# Patient Record
Sex: Female | Born: 1995 | Race: Black or African American | Hispanic: No | Marital: Single | Smoking: Current every day smoker
Health system: Southern US, Community
[De-identification: ages and names within clinical notes are randomized; demographics above are authoritative.]

## PROBLEM LIST (undated history)

## (undated) DIAGNOSIS — J45909 Unspecified asthma, uncomplicated: Secondary | ICD-10-CM

## (undated) HISTORY — PX: NO PAST SURGERIES: SHX2092

---
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
## 2014-02-02 ENCOUNTER — Emergency Department: Payer: Self-pay | Admitting: Emergency Medicine

## 2014-02-02 LAB — COMPREHENSIVE METABOLIC PANEL
ALT: 21 U/L
ANION GAP: 5 — AB (ref 7–16)
Albumin: 3.6 g/dL — ABNORMAL LOW (ref 3.8–5.6)
Alkaline Phosphatase: 48 U/L
BUN: 13 mg/dL (ref 7–18)
Bilirubin,Total: 0.8 mg/dL (ref 0.2–1.0)
Calcium, Total: 8.5 mg/dL — ABNORMAL LOW (ref 9.0–10.7)
Chloride: 108 mmol/L — ABNORMAL HIGH (ref 98–107)
Co2: 27 mmol/L (ref 21–32)
Creatinine: 0.81 mg/dL (ref 0.60–1.30)
EGFR (African American): >60
Glucose: 99 mg/dL (ref 65–99)
OSMOLALITY: 280 (ref 275–301)
Potassium: 3.8 mmol/L (ref 3.5–5.1)
SGOT(AST): 21 U/L (ref 0–26)
Sodium: 140 mmol/L (ref 136–145)
Total Protein: 7.1 g/dL (ref 6.4–8.6)

## 2014-02-02 LAB — CBC
HCT: 41.7 % (ref 35.0–47.0)
HGB: 14.4 g/dL (ref 12.0–16.0)
MCH: 30.6 pg (ref 26.0–34.0)
MCHC: 34.5 g/dL (ref 32.0–36.0)
MCV: 89 fL (ref 80–100)
Platelet: 261 10*3/uL (ref 150–440)
RBC: 4.71 10*6/uL (ref 3.80–5.20)
RDW: 12.4 % (ref 11.5–14.5)
WBC: 7.4 10*3/uL (ref 3.6–11.0)

## 2014-02-02 LAB — URINALYSIS, COMPLETE
BILIRUBIN, UR: NEGATIVE
Blood: NEGATIVE
GLUCOSE, UR: NEGATIVE mg/dL (ref 0–75)
KETONE: NEGATIVE
Nitrite: NEGATIVE
Ph: 5 (ref 4.5–8.0)
Protein: NEGATIVE
RBC,UR: 2 /HPF (ref 0–5)
Specific Gravity: 1.028 (ref 1.003–1.030)
Squamous Epithelial: 9
WBC UR: 12 /HPF (ref 0–5)

## 2014-03-08 ENCOUNTER — Emergency Department: Payer: Self-pay | Admitting: Emergency Medicine

## 2014-08-14 ENCOUNTER — Encounter: Payer: Self-pay | Admitting: *Deleted

## 2014-08-14 ENCOUNTER — Emergency Department
Admission: EM | Admit: 2014-08-14 | Discharge: 2014-08-14 | Disposition: A | Discharge status: Home / Self Care | Payer: BLUE CROSS/BLUE SHIELD | Attending: Emergency Medicine | Admitting: Emergency Medicine

## 2014-08-14 DIAGNOSIS — R0602 Shortness of breath: Secondary | ICD-10-CM | POA: Diagnosis present

## 2014-08-14 DIAGNOSIS — J45901 Unspecified asthma with (acute) exacerbation: Secondary | ICD-10-CM | POA: Diagnosis not present

## 2014-08-14 DIAGNOSIS — Z79899 Other long term (current) drug therapy: Secondary | ICD-10-CM | POA: Insufficient documentation

## 2014-08-14 HISTORY — DX: Unspecified asthma, uncomplicated: J45.909

## 2014-08-14 MED ORDER — PREDNISONE 20 MG PO TABS
50.0000 mg | ORAL_TABLET | Freq: Once | ORAL | Status: AC
  Administered 2014-08-14: 50 mg via ORAL

## 2014-08-14 MED ORDER — IPRATROPIUM-ALBUTEROL 0.5-2.5 (3) MG/3ML IN SOLN
3.0000 mL | Freq: Once | RESPIRATORY_TRACT | Status: AC
  Administered 2014-08-14: 19:00:00 via RESPIRATORY_TRACT
  Filled 2014-08-14: qty 3

## 2014-08-14 MED ORDER — IPRATROPIUM-ALBUTEROL 0.5-2.5 (3) MG/3ML IN SOLN
RESPIRATORY_TRACT | Status: AC
  Filled 2014-08-14: qty 3

## 2014-08-14 MED ORDER — PREDNISONE 10 MG PO TABS
ORAL_TABLET | ORAL | Status: AC
  Administered 2014-08-14: 50 mg via ORAL
  Filled 2014-08-14: qty 1

## 2014-08-14 MED ORDER — IPRATROPIUM-ALBUTEROL 0.5-2.5 (3) MG/3ML IN SOLN
3.0000 mL | Freq: Once | RESPIRATORY_TRACT | Status: AC
  Administered 2014-08-14: 3 mL via RESPIRATORY_TRACT

## 2014-08-14 MED ORDER — PREDNISONE 50 MG PO TABS
50.0000 mg | ORAL_TABLET | Freq: Every day | ORAL | Status: DC
Start: 2014-08-14 — End: 2015-12-01

## 2014-08-14 MED ORDER — IPRATROPIUM-ALBUTEROL 0.5-2.5 (3) MG/3ML IN SOLN
RESPIRATORY_TRACT | Status: AC
  Administered 2014-08-14: 3 mL via RESPIRATORY_TRACT
  Filled 2014-08-14: qty 3

## 2014-08-14 MED ORDER — PREDNISONE 20 MG PO TABS
ORAL_TABLET | ORAL | Status: AC
  Filled 2014-08-14: qty 2

## 2014-08-14 MED ORDER — ALBUTEROL SULFATE (2.5 MG/3ML) 0.083% IN NEBU: 2.5000 mg | INHALATION_SOLUTION | Freq: Four times a day (QID) | RESPIRATORY_TRACT | Status: DC | PRN

## 2014-08-14 NOTE — ED Provider Notes (Signed)
[facility] Emergency Department Provider Note  ____________________________________________  Time seen: 6:20 PM  I have reviewed the triage vital signs and the nursing notes.   HISTORY  Chief Complaint No chief complaint on file.    HPI Brenda Aguirre is a 19 y.o. female who presents with shortness of breath which started this morning around approximate 8 AM. She has a history of asthma and thinks that either allergies or an upper respiratory infection has caused an exacerbation of her asthma. She is out of her albuterol nebulizing solution. She denies chest pain she does report cough. No fevers or chills.     No past medical history on file.  There are no active problems to display for this patient.   No past surgical history on file.  Current Outpatient Rx  Name  Route  Sig  Dispense  Refill  . metroNIDAZOLE (FLAGYL) 500 MG tablet   Oral   Take 1 tablet (500 mg total) by mouth 2 (two) times daily. One po bid x 7 days   14 tablet   0     Allergies Review of patient's allergies indicates no known allergies.  No family history on file.  Social History History  Substance Use Topics  . Smoking status: Never Smoker   . Smokeless tobacco: Not on file  . Alcohol Use: No    Review of Systems  Constitutional: Negative for fever. Eyes: Negative for visual changes. ENT: Negative for sore throat. Positive for runny nose Cardiovascular: Negative for chest pain. Respiratory: Positive for shortness of breath Gastrointestinal: Negative for abdominal pain, vomiting and diarrhea. Genitourinary: Negative for dysuria. Musculoskeletal: Negative for back pain. Skin: Negative for rash. Neurological: Negative for headaches, focal weakness or numbness. Psychiatric: No anxiety  10-point ROS otherwise negative.  ____________________________________________   PHYSICAL EXAM:  VITAL SIGNS: ED Triage Vitals  Enc Vitals Group     BP --      Pulse  --      Resp --      Temp --      Temp src --      SpO2 --      Weight --      Height --      Head Cir --      Peak Flow --      Pain Score --      Pain Loc --      Pain Edu? --      Excl. in [place]? --      Constitutional: Alert and oriented. Well appearing and in no distress. Eyes: Conjunctivae are normal. PERRL. Normal extraocular movements. ENT   Head: Normocephalic and atraumatic.   Nose: No congestion/rhinnorhea.   Mouth/Throat: Mucous membranes are moist.   Neck: No stridor. Hematological/Lymphatic/Immunilogical: No cervical lymphadenopathy. Cardiovascular: Normal rate, regular rhythm. Normal and symmetric distal pulses are present in all extremities. No murmurs, rubs, or gallops. Respiratory: Normal respiratory effort without tachypnea nor retractions. Wheezing diffusely, mild Gastrointestinal: Soft and nontender. No distention. There is no CVA tenderness. Genitourinary: deferred Musculoskeletal: Nontender with normal range of motion in all extremities. No joint effusions.  No lower extremity tenderness nor edema. Neurologic:  Normal speech and language. No gross focal neurologic deficits are appreciated. Speech is normal.  Skin:  Skin is warm, dry and intact. No rash noted. Psychiatric: Mood and affect are normal. Speech and behavior are normal. Patient exhibits appropriate insight and judgment.  ____________________________________________    LABS (pertinent positives/negatives)  None  ____________________________________________  EKG  None  ____________________________________________    RADIOLOGY  None  ____________________________________________   PROCEDURES  Procedure(s) performed: None  Critical Care performed:None  ____________________________________________   INITIAL IMPRESSION / ASSESSMENT AND PLAN / ED COURSE  Pertinent labs & imaging results that were available during my care of the patient were reviewed by me and  considered in my medical decision making (see chart for details).  Seen on arrival mild wheezing diffusely no acute distress. DuoNeb started prednisone 50 mg by mouth. We will reassess  ____________________________________________ ----------------------------------------- 7:19 PM on 08/14/2014 -----------------------------------------  Patient reassessed: She has no wheezing and feels much better. Okay for discharge and PCP follow-up  FINAL CLINICAL IMPRESSION(S) / ED DIAGNOSES  Final diagnoses:  Asthma attack     Lavonia Drafts, MD 08/14/14 1919

## 2014-08-14 NOTE — ED Notes (Signed)
Pt reports shortness of breath and wheezing today, has been using albuterol inhaler at home without relief. Hx of asthma

## 2014-08-14 NOTE — Discharge Instructions (Signed)

## 2015-02-08 ENCOUNTER — Emergency Department
Admission: EM | Admit: 2015-02-08 | Discharge: 2015-02-08 | Discharge status: Absent without leave | Payer: BLUE CROSS/BLUE SHIELD

## 2015-08-28 ENCOUNTER — Encounter: Payer: Self-pay | Admitting: *Deleted

## 2015-08-28 ENCOUNTER — Emergency Department: Payer: BLUE CROSS/BLUE SHIELD

## 2015-08-28 ENCOUNTER — Emergency Department
Admission: EM | Admit: 2015-08-28 | Discharge: 2015-08-28 | Disposition: A | Discharge status: Home / Self Care | Payer: BLUE CROSS/BLUE SHIELD | Attending: Emergency Medicine | Admitting: Emergency Medicine

## 2015-08-28 DIAGNOSIS — Z79899 Other long term (current) drug therapy: Secondary | ICD-10-CM | POA: Insufficient documentation

## 2015-08-28 DIAGNOSIS — R10A0 Flank pain, unspecified side: Secondary | ICD-10-CM

## 2015-08-28 DIAGNOSIS — J45909 Unspecified asthma, uncomplicated: Secondary | ICD-10-CM | POA: Insufficient documentation

## 2015-08-28 DIAGNOSIS — Z792 Long term (current) use of antibiotics: Secondary | ICD-10-CM | POA: Insufficient documentation

## 2015-08-28 DIAGNOSIS — R109 Unspecified abdominal pain: Secondary | ICD-10-CM

## 2015-08-28 DIAGNOSIS — Z7952 Long term (current) use of systemic steroids: Secondary | ICD-10-CM | POA: Insufficient documentation

## 2015-08-28 DIAGNOSIS — N3090 Cystitis, unspecified without hematuria: Secondary | ICD-10-CM

## 2015-08-28 LAB — URINALYSIS COMPLETE WITH MICROSCOPIC (ARMC ONLY)
Bilirubin Urine: NEGATIVE
Glucose, UA: NEGATIVE mg/dL
Ketones, ur: NEGATIVE mg/dL
Leukocytes, UA: NEGATIVE
NITRITE: POSITIVE — AB
PH: 5 (ref 5.0–8.0)
PROTEIN: NEGATIVE mg/dL
SPECIFIC GRAVITY, URINE: 1.018 (ref 1.005–1.030)

## 2015-08-28 LAB — CHLAMYDIA/NGC RT PCR (ARMC ONLY)
Chlamydia Tr: NOT DETECTED
N gonorrhoeae: NOT DETECTED

## 2015-08-28 LAB — BASIC METABOLIC PANEL
Anion gap: 8 (ref 5–15)
BUN: 11 mg/dL (ref 6–20)
CO2: 24 mmol/L (ref 22–32)
Calcium: 9 mg/dL (ref 8.9–10.3)
Chloride: 106 mmol/L (ref 101–111)
Creatinine, Ser: 0.8 mg/dL (ref 0.44–1.00)
GFR calc Af Amer: >60 mL/min (ref >60)
GFR calc non Af Amer: >60 mL/min (ref >60)
Glucose, Bld: 99 mg/dL (ref 65–99)
POTASSIUM: 4.1 mmol/L (ref 3.5–5.1)
SODIUM: 138 mmol/L (ref 135–145)

## 2015-08-28 LAB — CBC
HEMATOCRIT: 41.9 % (ref 35.0–47.0)
Hemoglobin: 14.6 g/dL (ref 12.0–16.0)
MCH: 30.2 pg (ref 26.0–34.0)
MCHC: 34.9 g/dL (ref 32.0–36.0)
MCV: 86.5 fL (ref 80.0–100.0)
PLATELETS: 287 10*3/uL (ref 150–440)
RBC: 4.85 MIL/uL (ref 3.80–5.20)
RDW: 13.4 % (ref 11.5–14.5)
WBC: 9.2 10*3/uL (ref 3.6–11.0)

## 2015-08-28 LAB — PREGNANCY, URINE: Preg Test, Ur: NEGATIVE

## 2015-08-28 MED ORDER — KETOROLAC TROMETHAMINE 0.5 % OP SOLN: 1.0000 [drp] | Freq: Four times a day (QID) | OPHTHALMIC | Status: DC

## 2015-08-28 MED ORDER — IBUPROFEN 800 MG PO TABS: 800.0000 mg | ORAL_TABLET | Freq: Three times a day (TID) | ORAL | Status: DC | PRN

## 2015-08-28 MED ORDER — TRAMADOL HCL 50 MG PO TABS
50.0000 mg | ORAL_TABLET | Freq: Once | ORAL | Status: AC
  Administered 2015-08-28: 50 mg via ORAL
  Filled 2015-08-28: qty 1

## 2015-08-28 MED ORDER — POLYETHYLENE GLYCOL 3350 17 G PO PACK: 17.0000 g | PACK | Freq: Every day | ORAL | Status: DC

## 2015-08-28 MED ORDER — SULFAMETHOXAZOLE-TRIMETHOPRIM 800-160 MG PO TABS: 1.0000 | ORAL_TABLET | Freq: Two times a day (BID) | ORAL | Status: DC

## 2015-08-28 MED ORDER — IBUPROFEN 800 MG PO TABS
800.0000 mg | ORAL_TABLET | Freq: Once | ORAL | Status: AC
  Administered 2015-08-28: 800 mg via ORAL
  Filled 2015-08-28: qty 1

## 2015-08-28 NOTE — ED Provider Notes (Signed)
[facility] Emergency Department Provider Note        Time seen: ----------------------------------------- 2:30 PM on 08/28/2015 -----------------------------------------    I have reviewed the triage vital signs and the nursing notes.   HISTORY  Chief Complaint Flank Pain    HPI Brenda Aguirre is a 20 y.o. female who presents ER for right flank pain. Patient states sometimes is worse when she is moving her stretching. She has had some nausea, nothing makes her symptoms better. She denies any other complaints, denies fevers, vomiting or diarrhea. His recently had some constipation.   Past Medical History  Diagnosis Date  . Asthma     There are no active problems to display for this patient.   History reviewed. No pertinent past surgical history.  Allergies Review of patient's allergies indicates no known allergies.  Social History Social History  Substance Use Topics  . Smoking status: Never Smoker   . Smokeless tobacco: None  . Alcohol Use: No    Review of Systems Constitutional: Negative for fever. Eyes: Negative for visual changes. ENT: Negative for sore throat. Cardiovascular: Negative for chest pain. Respiratory: Negative for shortness of breath. Gastrointestinal: Positive right flank pain Genitourinary: Negative for dysuria. Musculoskeletal: Negative for back pain. Skin: Negative for rash. Neurological: Negative for headaches, focal weakness or numbness.  10-point ROS otherwise negative.  ____________________________________________   PHYSICAL EXAM:  VITAL SIGNS: ED Triage Vitals  Enc Vitals Group     BP 08/28/15 1247 126/69 mmHg     Pulse Rate 08/28/15 1247 104     Resp 08/28/15 1247 20     Temp 08/28/15 1247 98.2 F (36.8 C)     Temp Source 08/28/15 1247 Oral     SpO2 08/28/15 1247 100 %     Weight 08/28/15 1247 130 lb (58.968 kg)     Height 08/28/15 1247 5' (1.524 m)     Head Cir --      Peak Flow --       Pain Score 08/28/15 1247 8     Pain Loc --      Pain Edu? --      Excl. in [place]? --     Constitutional: Alert and oriented. Well appearing and in no distress. Eyes: Conjunctivae are normal. PERRL. Normal extraocular movements. ENT   Head: Normocephalic and atraumatic.   Nose: No congestion/rhinnorhea.   Mouth/Throat: Mucous membranes are moist.   Neck: No stridor. Cardiovascular: Normal rate, regular rhythm. No murmurs, rubs, or gallops. Respiratory: Normal respiratory effort without tachypnea nor retractions. Breath sounds are clear and equal bilaterally. No wheezes/rales/rhonchi. Gastrointestinal: Soft and nontender. Normal bowel sounds Musculoskeletal: Nontender with normal range of motion in all extremities. No lower extremity tenderness nor edema. Neurologic:  Normal speech and language. No gross focal neurologic deficits are appreciated.  Skin:  Skin is warm, dry and intact. No rash noted. Psychiatric: Mood and affect are normal. Speech and behavior are normal.   ____________________________________________  ED COURSE:  Pertinent labs & imaging results that were available during my care of the patient were reviewed by me and considered in my medical decision making (see chart for details). Patient is in no acute distress, will check basic labs and likely KUB. ____________________________________________    LABS (pertinent positives/negatives)  Labs Reviewed  URINALYSIS COMPLETEWITH MICROSCOPIC ([facility] ONLY) - Abnormal; Notable for the following:    Color, Urine YELLOW (*)    APPearance HAZY (*)    Hgb urine dipstick 1+ (*)  Nitrite POSITIVE (*)    Bacteria, UA MANY (*)    Squamous Epithelial / LPF 0-5 (*)    All other components within normal limits  CHLAMYDIA/NGC RT PCR ([facility] ONLY)  BASIC METABOLIC PANEL  CBC  PREGNANCY, URINE    RADIOLOGY Images were viewed by me  KUB  IMPRESSION: No acute abnormality  noted.  ____________________________________________  FINAL ASSESSMENT AND PLAN  Flank pain, cystitis  Plan: Patient with labs and imaging as dictated above. Patient's urinalysis was somewhat suggestive of a UTI. She'll be on 3 days of Septra, I will also encourage MiraLAX as she appears somewhat constipated. She is stable for outpatient follow-up.   Earleen Newport, MD   Note: This dictation was prepared with Dragon dictation. Any transcriptional errors that result from this process are unintentional   Earleen Newport, MD 08/28/15 [phone]

## 2015-08-28 NOTE — ED Notes (Signed)
Pt complains of right flank pain radiating to abdomen, pt denies any other symptoms

## 2015-08-28 NOTE — ED Notes (Signed)
Lab called regarding add on urine, will add on at this time

## 2015-08-28 NOTE — ED Notes (Signed)
Pt returned from Xray at this time  

## 2015-08-28 NOTE — ED Notes (Signed)
MD Williams at bedside.  

## 2015-08-28 NOTE — ED Notes (Signed)
Pt c/o right flank pain that radiates to the RLQ with nausea since this morning.Brenda Aguirre

## 2015-08-28 NOTE — Discharge Instructions (Signed)
Flank Pain °Flank pain refers to pain that is located on the side of the body between the upper abdomen and the back. The pain may occur over a short period of time (acute) or may be long-term or reoccurring (chronic). It may be mild or severe. Flank pain can be caused by many things. °CAUSES  °Some of the more common causes of flank pain include: °· Muscle strains.   °· Muscle spasms.   °· A disease of your spine (vertebral disk disease).   °· A lung infection (pneumonia).   °· Fluid around your lungs (pulmonary edema).   °· A kidney infection.   °· Kidney stones.   °· A very painful skin rash caused by the chickenpox virus (shingles).   °· Gallbladder disease.   °HOME CARE INSTRUCTIONS  °Home care will depend on the cause of your pain. In general, °· Rest as directed by your caregiver. °· Drink enough fluids to keep your urine clear or pale yellow. °· Only take over-the-counter or prescription medicines as directed by your caregiver. Some medicines may help relieve the pain. °· Tell your caregiver about any changes in your pain. °· Follow up with your caregiver as directed. °SEEK IMMEDIATE MEDICAL CARE IF:  °· Your pain is not controlled with medicine.   °· You have new or worsening symptoms. °· Your pain increases.   °· You have abdominal pain.   °· You have shortness of breath.   °· You have persistent nausea or vomiting.   °· You have swelling in your abdomen.   °· You feel faint or pass out.   °· You have blood in your urine. °· You have a fever or persistent symptoms for more than 2-3 days. °· You have a fever and your symptoms suddenly get worse. °MAKE SURE YOU:  °· Understand these instructions. °· Will watch your condition. °· Will get help right away if you are not doing well or get worse. °  °This information is not intended to replace advice given to you by your health care provider. Make sure you discuss any questions you have with your health care provider. °  °Document Released: 05/15/2005 Document  Revised: 12/17/2011 Document Reviewed: 11/06/2011 °Elsevier Interactive Patient Education ©2016 [employer] Inc[employer] ° °Urinary Tract Infection °Urinary tract infections (UTIs) can develop anywhere along your urinary tract. Your urinary tract is your body's drainage system for removing wastes and extra water. Your urinary tract includes two kidneys, two ureters, a bladder, and a urethra. Your kidneys are a pair of bean-shaped organs. Each kidney is about the size of your fist. They are located below your ribs, one on each side of your spine. °CAUSES °Infections are caused by microbes, which are microscopic organisms, including fungi, viruses, and bacteria. These organisms are so small that they can only be seen through a microscope. Bacteria are the microbes that most commonly cause UTIs. °SYMPTOMS  °Symptoms of UTIs may vary by age and gender of the patient and by the location of the infection. Symptoms in young women typically include a frequent and intense urge to urinate and a painful, burning feeling in the bladder or urethra during urination. Older women and men are more likely to be tired, shaky, and weak and have muscle aches and abdominal pain. A fever may mean the infection is in your kidneys. Other symptoms of a kidney infection include pain in your back or sides below the ribs, nausea, and vomiting. °DIAGNOSIS °To diagnose a UTI, your caregiver will ask you about your symptoms. Your caregiver will also ask you to provide a urine sample. The   urine sample will be tested for bacteria and white blood cells. White blood cells are made by your body to help fight infection. °TREATMENT  °Typically, UTIs can be treated with medication. Because most UTIs are caused by a bacterial infection, they usually can be treated with the use of antibiotics. The choice of antibiotic and length of treatment depend on your symptoms and the type of bacteria causing your infection. °HOME CARE INSTRUCTIONS °· If you were prescribed  antibiotics, take them exactly as your caregiver instructs you. Finish the medication even if you feel better after you have only taken some of the medication. °· Drink enough water and fluids to keep your urine clear or pale yellow. °· Avoid caffeine, tea, and carbonated beverages. They tend to irritate your bladder. °· Empty your bladder often. Avoid holding urine for long periods of time. °· Empty your bladder before and after sexual intercourse. °· After a bowel movement, women should cleanse from front to back. Use each tissue only once. °SEEK MEDICAL CARE IF:  °· You have back pain. °· You develop a fever. °· Your symptoms do not begin to resolve within 3 days. °SEEK IMMEDIATE MEDICAL CARE IF:  °· You have severe back pain or lower abdominal pain. °· You develop chills. °· You have nausea or vomiting. °· You have continued burning or discomfort with urination. °MAKE SURE YOU:  °· Understand these instructions. °· Will watch your condition. °· Will get help right away if you are not doing well or get worse. °  °This information is not intended to replace advice given to you by your health care provider. Make sure you discuss any questions you have with your health care provider. °  °Document Released: 01/01/2005 Document Revised: 12/13/2014 Document Reviewed: 05/02/2011 °Elsevier Interactive Patient Education ©2016 Elsevier Inc. ° °

## 2015-11-30 ENCOUNTER — Emergency Department
Admission: EM | Admit: 2015-11-30 | Discharge: 2015-12-01 | Disposition: A | Discharge status: Home / Self Care | Payer: BLUE CROSS/BLUE SHIELD | Attending: Emergency Medicine | Admitting: Emergency Medicine

## 2015-11-30 ENCOUNTER — Encounter: Payer: Self-pay | Admitting: Emergency Medicine

## 2015-11-30 ENCOUNTER — Emergency Department: Payer: BLUE CROSS/BLUE SHIELD

## 2015-11-30 DIAGNOSIS — J45901 Unspecified asthma with (acute) exacerbation: Secondary | ICD-10-CM | POA: Insufficient documentation

## 2015-11-30 DIAGNOSIS — Z79899 Other long term (current) drug therapy: Secondary | ICD-10-CM | POA: Insufficient documentation

## 2015-11-30 NOTE — ED Notes (Signed)
Flex nurse to front desk to report that flex providers reviewed chart. Not willing to see patient in flex care at this time. Patient moved back into major waiting room; WTBS by EDP on major side at this time. ED charge nurse made aware.

## 2015-11-30 NOTE — ED Triage Notes (Signed)
C/O coughing and wheezing x 2 weeks.  Seen by Princella Ion and started on prednisone.  Patient states she has two prescriptions waiting to be picked up at the pharmacy, but has not picked up RX yet.

## 2015-12-01 LAB — CBC WITH DIFFERENTIAL/PLATELET
Basophils Absolute: 0.1 10*3/uL (ref 0–0.1)
Basophils Relative: 0 %
Eosinophils Absolute: 0 10*3/uL (ref 0–0.7)
Eosinophils Relative: 0 %
HEMATOCRIT: 46.3 % (ref 35.0–47.0)
Hemoglobin: 16.6 g/dL — ABNORMAL HIGH (ref 12.0–16.0)
Lymphs Abs: 0.6 10*3/uL — ABNORMAL LOW (ref 1.0–3.6)
MCH: 30.6 pg (ref 26.0–34.0)
MCHC: 35.8 g/dL (ref 32.0–36.0)
MCV: 85.5 fL (ref 80.0–100.0)
MONO ABS: 0.1 10*3/uL — AB (ref 0.2–0.9)
Neutro Abs: 13.4 10*3/uL — ABNORMAL HIGH (ref 1.4–6.5)
Neutrophils Relative %: 95 %
Platelets: 303 10*3/uL (ref 150–440)
RBC: 5.42 MIL/uL — ABNORMAL HIGH (ref 3.80–5.20)
RDW: 13 % (ref 11.5–14.5)
WBC: 14.2 10*3/uL — ABNORMAL HIGH (ref 3.6–11.0)

## 2015-12-01 LAB — BASIC METABOLIC PANEL
ANION GAP: 10 (ref 5–15)
BUN: 10 mg/dL (ref 6–20)
CO2: 24 mmol/L (ref 22–32)
Calcium: 9.9 mg/dL (ref 8.9–10.3)
Chloride: 103 mmol/L (ref 101–111)
Creatinine, Ser: 0.74 mg/dL (ref 0.44–1.00)
GFR calc Af Amer: >60 mL/min (ref >60)
GFR calc non Af Amer: >60 mL/min (ref >60)
GLUCOSE: 116 mg/dL — AB (ref 65–99)
POTASSIUM: 3.7 mmol/L (ref 3.5–5.1)
Sodium: 137 mmol/L (ref 135–145)

## 2015-12-01 LAB — FIBRIN DERIVATIVES D-DIMER (ARMC ONLY): Fibrin derivatives D-dimer (ARMC): 208 (ref 0–499)

## 2015-12-01 LAB — HCG, QUANTITATIVE, PREGNANCY: hCG, Beta Chain, Quant, S: 1 m[IU]/mL (ref <5)

## 2015-12-01 MED ORDER — IPRATROPIUM-ALBUTEROL 0.5-2.5 (3) MG/3ML IN SOLN
3.0000 mL | Freq: Once | RESPIRATORY_TRACT | Status: AC
  Administered 2015-12-01: 3 mL via RESPIRATORY_TRACT
  Filled 2015-12-01: qty 3

## 2015-12-01 MED ORDER — SODIUM CHLORIDE 0.9 % IV BOLUS (SEPSIS)
1000.0000 mL | Freq: Once | INTRAVENOUS | Status: AC
  Administered 2015-12-01: 1000 mL via INTRAVENOUS

## 2015-12-01 MED ORDER — SODIUM CHLORIDE 0.9 % IV BOLUS (SEPSIS)
1000.0000 mL | Freq: Once | INTRAVENOUS | Status: AC
  Administered 2015-12-01: 1000 mL via INTRAVENOUS
  Filled 2015-12-01: qty 1000

## 2015-12-01 MED ORDER — METHYLPREDNISOLONE SODIUM SUCC 125 MG IJ SOLR
125.0000 mg | Freq: Once | INTRAMUSCULAR | Status: AC
  Administered 2015-12-01: 125 mg via INTRAVENOUS
  Filled 2015-12-01: qty 2

## 2015-12-01 MED ORDER — AZITHROMYCIN 250 MG PO TABS: ORAL_TABLET | ORAL | 0 refills | Status: DC

## 2015-12-01 MED ORDER — ALBUTEROL SULFATE (2.5 MG/3ML) 0.083% IN NEBU
5.0000 mg | INHALATION_SOLUTION | Freq: Once | RESPIRATORY_TRACT | Status: AC
  Administered 2015-12-01: 5 mg via RESPIRATORY_TRACT
  Filled 2015-12-01: qty 6

## 2015-12-01 MED ORDER — PREDNISONE 20 MG PO TABS: 20.0000 mg | ORAL_TABLET | Freq: Every day | ORAL | 0 refills | Status: DC

## 2015-12-01 NOTE — ED Provider Notes (Signed)
[facility] Emergency Department Provider Note  ____________________________________________  Time seen: Approximately 12:41 AM  I have reviewed the triage vital signs and the nursing notes.   HISTORY  Chief Complaint Wheezing    HPI Brenda Aguirre is a 20 y.o. female who complains of shortness of breath for the past 2 weeks. Occasional cough which is nonproductive. No fevers or chills. Has seen her primary care doctor, she's been taking nebulized albuterol at home. She completed a course of prednisone. Her primary care doctor also prescribed her a long-acting corticosteroid inhaler which she has not yet picked up from the pharmacy as well as a albuterol inhaler which she is also not picked up from the pharmacy. Reports her symptoms are worse at night. No exertional symptoms. She does have some chest tightness and it is somewhat worsened by deep breathing.  No recent travel trauma hospitalizations or surgeries. No history of DVT or PE.  Patient reports using her nebulizer today "too much". She estimates she is used 6-8 albuterol doses today. Last was 8:30 PM.   Past Medical History:  Diagnosis Date  . Asthma      There are no active problems to display for this patient.    History reviewed. No pertinent surgical history.   Prior to Admission medications   Medication Sig Start Date End Date Taking? Authorizing Provider  albuterol (PROVENTIL HFA;VENTOLIN HFA) 108 (90 BASE) MCG/ACT inhaler Inhale 2 puffs into the lungs every 6 (six) hours as needed for wheezing or shortness of breath.    Historical Provider, MD  albuterol (PROVENTIL) (2.5 MG/3ML) 0.083% nebulizer solution Take 3 mLs (2.5 mg total) by nebulization every 6 (six) hours as needed for wheezing or shortness of breath. 08/14/14   Lavonia Drafts, MD  azithromycin (ZITHROMAX Z-PAK) 250 MG tablet Take 2 tablets (500 mg) on  Day 1,  followed by 1 tablet (250 mg) once daily on Days 2 through 5. 12/01/15    Carrie Mew, MD  ibuprofen (ADVIL,MOTRIN) 800 MG tablet Take 1 tablet (800 mg total) by mouth every 8 (eight) hours as needed. 08/28/15   Earleen Newport, MD  ketorolac (ACULAR) 0.5 % ophthalmic solution Place 1 drop into both eyes 4 (four) times daily. 08/28/15   Earleen Newport, MD  metroNIDAZOLE (FLAGYL) 500 MG tablet Take 1 tablet (500 mg total) by mouth 2 (two) times daily. One po bid x 7 days 12/07/12   Madaline Brilliant, MD  polyethylene glycol [facility] / GLYCOLAX) packet Take 17 g by mouth daily. 08/28/15   Earleen Newport, MD  predniSONE (DELTASONE) 20 MG tablet Take 1 tablet (20 mg total) by mouth daily. 12/01/15   Carrie Mew, MD  sulfamethoxazole-trimethoprim (BACTRIM DS) 800-160 MG tablet Take 1 tablet by mouth 2 (two) times daily. 08/28/15   Earleen Newport, MD     Allergies Review of patient's allergies indicates no known allergies.   No family history on file.  Social History Social History  Substance Use Topics  . Smoking status: Never Smoker  . Smokeless tobacco: Never Used  . Alcohol use No    Review of Systems  Constitutional:   No fever or chills.  ENT:   No sore throat. No rhinorrhea. Cardiovascular:   Positive chest tightness. Respiratory:   Positive subacute shortness of breath and nonproductive cough Gastrointestinal:   Negative for abdominal pain, vomiting and diarrhea.  Genitourinary:   Negative for dysuria or difficulty urinating. Musculoskeletal:   Negative for focal pain or swelling  10-point ROS otherwise negative.  ____________________________________________   PHYSICAL EXAM:  VITAL SIGNS: ED Triage Vitals  Enc Vitals Group     BP 11/30/15 2202 132/78     Pulse Rate 11/30/15 2202 (!) 140     Resp 11/30/15 2202 18     Temp 11/30/15 2202 (!) 94 F (34.4 C)     Temp Source 11/30/15 2202 Oral     SpO2 11/30/15 2202 94 %     Weight 11/30/15 2204 135 lb (61.2 kg)     Height 11/30/15 2204 5' (1.524 m)     Head Circumference  --      Peak Flow --      Pain Score 11/30/15 2204 0     Pain Loc --      Pain Edu? --      Excl. in [place]? --     Vital signs reviewed, nursing assessments reviewed.   Constitutional:   Alert and oriented. Well appearing and in no distress. Eyes:   No scleral icterus. No conjunctival pallor. PERRL. EOMI.  No nystagmus. ENT   Head:   Normocephalic and atraumatic.   Nose:   No congestion/rhinnorhea. No septal hematoma   Mouth/Throat:   MMM, no pharyngeal erythema. No peritonsillar mass.    Neck:   No stridor. No SubQ emphysema. No meningismus. Hematological/Lymphatic/Immunilogical:   No cervical lymphadenopathy. Cardiovascular:   Tachycardia heart rate 1:30. Symmetric bilateral radial and DP pulses.  No murmurs.  Respiratory:   Diffuse wheezing with inspiration and expiration. Normal expiratory phase.. Gastrointestinal:   Soft and nontender. Non distended. There is no CVA tenderness.  No rebound, rigidity, or guarding. Genitourinary:   deferred Musculoskeletal:   Nontender with normal range of motion in all extremities. No joint effusions.  No lower extremity tenderness.  No edema. Neurologic:   Normal speech and language.  CN 2-10 normal. Motor grossly intact. No gross focal neurologic deficits are appreciated.  Skin:    Skin is warm, dry and intact. No rash noted.  No petechiae, purpura, or bullae.  ____________________________________________    LABS (pertinent positives/negatives) (all labs ordered are listed, but only abnormal results are displayed) Labs Reviewed  BASIC METABOLIC PANEL - Abnormal; Notable for the following:       Result Value   Glucose, Bld 116 (*)    All other components within normal limits  CBC WITH DIFFERENTIAL/PLATELET - Abnormal; Notable for the following:    WBC 14.2 (*)    RBC 5.42 (*)    Hemoglobin 16.6 (*)    Neutro Abs 13.4 (*)    Lymphs Abs 0.6 (*)    Monocytes Absolute 0.1 (*)    All other components within normal limits   FIBRIN DERIVATIVES D-DIMER ([facility] ONLY)  HCG, QUANTITATIVE, PREGNANCY   ____________________________________________   EKG    ____________________________________________    RADIOLOGY  Chest x-ray unremarkable  ____________________________________________   PROCEDURES Procedures  ____________________________________________   INITIAL IMPRESSION / ASSESSMENT AND PLAN / ED COURSE  Pertinent labs & imaging results that were available during my care of the patient were reviewed by me and considered in my medical decision making (see chart for details).  Patient presents with ongoing shortness of breath and wheezing despite using bronchodilators at home medical completing a course of prednisone. Chest x-ray unremarkable. We'll check labs including d-dimer. IV fluids for her tachycardia which may be related to beta agonists overuse. Give prednisone and bronchodilators in the ED an attempt to control her wheezing. We'll plan to start  the patient on antibiotics given the prolonged symptoms.     Clinical Course  Value Comment By Time  Hemoglobin: (!) 16.6 CBC consistent with hemoconcentration. Possible dehydration as explanation for the patient's tachycardia. Patient admits that she doesn't drink a lot of fluids and only drinks soda. Carrie Mew, MD 08/26 [phone]    ----------------------------------------- 3:29 AM on 12/01/2015 -----------------------------------------  Patient feels much better, wishes to go home. Workup overall unremarkable. D-dimer negative. Repeat lung auscultation is greatly improved. Heart rate is about 125, likely in large part due to multiple doses of bronchodilators given. Low suspicion for PE ACS dissection or any other vascular event or sepsis or shock. ____________________________________________   FINAL CLINICAL IMPRESSION(S) / ED DIAGNOSES  Final diagnoses:  Asthma exacerbation       Portions of this note were generated with dragon  dictation software. Dictation errors may occur despite best attempts at proofreading.    Carrie Mew, MD 12/01/15 0330

## 2016-08-11 ENCOUNTER — Encounter: Payer: Self-pay | Admitting: Emergency Medicine

## 2016-08-11 ENCOUNTER — Emergency Department
Admission: EM | Admit: 2016-08-11 | Discharge: 2016-08-11 | Disposition: A | Discharge status: Home / Self Care | Payer: BLUE CROSS/BLUE SHIELD | Attending: Emergency Medicine | Admitting: Emergency Medicine

## 2016-08-11 ENCOUNTER — Emergency Department: Payer: BLUE CROSS/BLUE SHIELD

## 2016-08-11 DIAGNOSIS — J45901 Unspecified asthma with (acute) exacerbation: Secondary | ICD-10-CM

## 2016-08-11 DIAGNOSIS — J45909 Unspecified asthma, uncomplicated: Secondary | ICD-10-CM | POA: Insufficient documentation

## 2016-08-11 LAB — POCT PREGNANCY, URINE: PREG TEST UR: NEGATIVE

## 2016-08-11 MED ORDER — IPRATROPIUM-ALBUTEROL 0.5-2.5 (3) MG/3ML IN SOLN
3.0000 mL | Freq: Once | RESPIRATORY_TRACT | Status: AC
  Administered 2016-08-11: 3 mL via RESPIRATORY_TRACT
  Filled 2016-08-11: qty 3

## 2016-08-11 MED ORDER — ALBUTEROL SULFATE HFA 108 (90 BASE) MCG/ACT IN AERS: 2.0000 | INHALATION_SPRAY | Freq: Four times a day (QID) | RESPIRATORY_TRACT | 1 refills | Status: DC | PRN

## 2016-08-11 MED ORDER — PREDNISONE 10 MG PO TABS: ORAL_TABLET | ORAL | 0 refills | Status: DC

## 2016-08-11 MED ORDER — ALBUTEROL SULFATE (2.5 MG/3ML) 0.083% IN NEBU: 2.5000 mg | INHALATION_SOLUTION | Freq: Four times a day (QID) | RESPIRATORY_TRACT | 12 refills | Status: DC | PRN

## 2016-08-11 MED ORDER — PREDNISONE 20 MG PO TABS
60.0000 mg | ORAL_TABLET | Freq: Once | ORAL | Status: AC
  Administered 2016-08-11: 60 mg via ORAL

## 2016-08-11 MED ORDER — PREDNISONE 20 MG PO TABS
ORAL_TABLET | ORAL | Status: AC
  Filled 2016-08-11: qty 3

## 2016-08-11 MED ORDER — IPRATROPIUM-ALBUTEROL 0.5-2.5 (3) MG/3ML IN SOLN
3.0000 mL | Freq: Once | RESPIRATORY_TRACT | Status: AC
  Administered 2016-08-11: 3 mL via RESPIRATORY_TRACT

## 2016-08-11 NOTE — ED Triage Notes (Signed)
Pt c/o shortness of breath, has asthma and is out of inhaler. Wheezing heard on expiration in triage.

## 2016-08-11 NOTE — Discharge Instructions (Signed)
Follow-up with [facility] clinic or primary care provider of your choice. Call family practice is in [place] for an appointment.  Continue prednisone as directed starting tomorrow as you were given your first dose in the emergency department. Albuterol inhaler as needed for wheezing.

## 2016-08-11 NOTE — ED Provider Notes (Signed)
[facility] Emergency Department Provider Note   ____________________________________________   First MD Initiated Contact with Patient 08/11/16 1343     (approximate)  I have reviewed the triage vital signs and the nursing notes.   HISTORY  Chief Complaint Shortness of Breath    HPI Brenda Aguirre is a 21 y.o. female is here with complaint of shortness of breath today. Patient states that she has a history of asthma and she does not have an inhaler. Patient was wheezing on arrival to triage. Patient was given a duo neb nebulizer treatment in the triage area. Patient states that since being in the exam room she has began to wheeze again. Patient denies any upper respiratory symptoms, fever, chills, nausea or vomiting. Patient admits to occasionally smoking. She does not have a primary care doctor and no refills on her inhaler. She rates her pain as a 0/10.   Past Medical History:  Diagnosis Date  . Asthma     There are no active problems to display for this patient.   No past surgical history on file.  Prior to Admission medications   Medication Sig Start Date End Date Taking? Authorizing Provider  albuterol (PROVENTIL HFA;VENTOLIN HFA) 108 (90 Base) MCG/ACT inhaler Inhale 2 puffs into the lungs every 6 (six) hours as needed for wheezing or shortness of breath. 08/11/16   Johnn Hai, PA-C  albuterol (PROVENTIL) (2.5 MG/3ML) 0.083% nebulizer solution Take 3 mLs (2.5 mg total) by nebulization every 6 (six) hours as needed for wheezing or shortness of breath. 08/11/16   Johnn Hai, PA-C  polyethylene glycol (MIRALAX / GLYCOLAX) packet Take 17 g by mouth daily. 08/28/15   Earleen Newport, MD  predniSONE (DELTASONE) 10 MG tablet Take 5 tablets  tomorrow, on day 2 take 4 tablets, day 3 take 3 tablets, day 4 take 2 tablets, day 5 take 1tablets 08/11/16   Johnn Hai, PA-C    Allergies Patient has no known allergies.  No family history  on file.  Social History Social History  Substance Use Topics  . Smoking status: Never Smoker  . Smokeless tobacco: Never Used  . Alcohol use No    Review of Systems Constitutional: No fever/chills ENT: No sore throat. Cardiovascular: Denies chest pain. Respiratory:Positive for wheezing. Gastrointestinal:   No nausea, no vomiting.  Musculoskeletal: Negative for back pain. Skin: Negative for rash. Neurological: Negative for headaches, focal weakness or numbness.   ____________________________________________   PHYSICAL EXAM:  VITAL SIGNS: ED Triage Vitals [08/11/16 0940]  Enc Vitals Group     BP 122/82     Pulse Rate 82     Resp 20     Temp 98.6 F (37 C)     Temp Source Oral     SpO2 99 %     Weight 150 lb (68 kg)     Height 5' (1.524 m)     Head Circumference      Peak Flow      Pain Score 0     Pain Loc      Pain Edu?      Excl. in [place]?     Constitutional: Alert and oriented. Well appearing and in no acute distress.Patient is in no acute distress talking in complete sentences. She is also talking on her cell phone without any difficulties. Eyes: Conjunctivae are normal. PERRL. EOMI. Head: Atraumatic. Nose: No congestion/rhinnorhea. Mouth/Throat: Mucous membranes are moist.  Oropharynx non-erythematous. Neck: No stridor.   Cardiovascular:  Normal rate, regular rhythm. Grossly normal heart sounds.  Good peripheral circulation. Respiratory: Normal respiratory effort.  No retractions. Lungs CTAB. Gastrointestinal: Soft and nontender. No distention. No abdominal bruits. No CVA tenderness. Musculoskeletal: No lower extremity tenderness nor edema.  No joint effusions. Neurologic:  Normal speech and language. No gross focal neurologic deficits are appreciated. No gait instability. Skin:  Skin is warm, dry and intact. No rash noted. Psychiatric: Mood and affect are normal. Speech and behavior are normal.  ____________________________________________   LABS (all  labs ordered are listed, but only abnormal results are displayed)  Labs Reviewed  POCT PREGNANCY, URINE  POC URINE PREG, ED    RADIOLOGY Chest x-ray per radiologist is negative for cardiopulmonary disease. I, Johnn Hai, personally viewed and evaluated these images (plain radiographs) as part of my medical decision making, as well as reviewing the written report by the radiologist.  ____________________________________________   PROCEDURES  Procedure(s) performed: None  Procedures  Critical Care performed: No  ____________________________________________   INITIAL IMPRESSION / ASSESSMENT AND PLAN / ED COURSE  Pertinent labs & imaging results that were available during my care of the patient were reviewed by me and considered in my medical decision making (see chart for details).  Patient was given a nebulized treatment in the triage department as well as in the treatment room. Patient was given prednisone 60 mg by mouth. Patient is to continue with prednisone tapered dose. She was given a prescription for albuterol inhaler as well as albuterol solution and a nebulizer machine. Patient was encouraged to get a PCP to further control her asthma. She was discharged without any continued problems.    ____________________________________________   FINAL CLINICAL IMPRESSION(S) / ED DIAGNOSES  Final diagnoses:  Mild asthma with exacerbation, unspecified whether persistent      NEW MEDICATIONS STARTED DURING THIS VISIT:  Discharge Medication List as of 08/11/2016  2:33 PM       Note:  This document was prepared using Dragon voice recognition software and may include unintentional dictation errors.    Johnn Hai, PA-C 08/11/16 1509    Schuyler Amor, MD 08/18/16 [phone]

## 2017-04-12 ENCOUNTER — Encounter: Payer: Self-pay | Admitting: Emergency Medicine

## 2017-04-12 ENCOUNTER — Emergency Department
Admission: EM | Admit: 2017-04-12 | Discharge: 2017-04-12 | Disposition: A | Discharge status: Home / Self Care | Payer: BLUE CROSS/BLUE SHIELD | Attending: Emergency Medicine | Admitting: Emergency Medicine

## 2017-04-12 ENCOUNTER — Emergency Department: Payer: BLUE CROSS/BLUE SHIELD

## 2017-04-12 ENCOUNTER — Other Ambulatory Visit: Payer: Self-pay

## 2017-04-12 DIAGNOSIS — Z79899 Other long term (current) drug therapy: Secondary | ICD-10-CM | POA: Insufficient documentation

## 2017-04-12 DIAGNOSIS — J45901 Unspecified asthma with (acute) exacerbation: Secondary | ICD-10-CM | POA: Insufficient documentation

## 2017-04-12 LAB — BASIC METABOLIC PANEL
Anion gap: 10 (ref 5–15)
BUN: 8 mg/dL (ref 6–20)
CHLORIDE: 105 mmol/L (ref 101–111)
CO2: 23 mmol/L (ref 22–32)
CREATININE: 0.75 mg/dL (ref 0.44–1.00)
Calcium: 9.3 mg/dL (ref 8.9–10.3)
GFR calc non Af Amer: >60 mL/min (ref >60)
GLUCOSE: 101 mg/dL — AB (ref 65–99)
Potassium: 3.9 mmol/L (ref 3.5–5.1)
Sodium: 138 mmol/L (ref 135–145)

## 2017-04-12 LAB — CBC WITH DIFFERENTIAL/PLATELET
BASOS PCT: 1 %
Basophils Absolute: 0 10*3/uL (ref 0–0.1)
Eosinophils Absolute: 0.3 10*3/uL (ref 0–0.7)
Eosinophils Relative: 3 %
HCT: 43 % (ref 35.0–47.0)
HEMOGLOBIN: 15 g/dL (ref 12.0–16.0)
LYMPHS ABS: 1.3 10*3/uL (ref 1.0–3.6)
LYMPHS PCT: 14 %
MCH: 30.4 pg (ref 26.0–34.0)
MCHC: 34.8 g/dL (ref 32.0–36.0)
MCV: 87.4 fL (ref 80.0–100.0)
MONO ABS: 0.7 10*3/uL (ref 0.2–0.9)
Monocytes Relative: 7 %
NEUTROS ABS: 6.9 10*3/uL — AB (ref 1.4–6.5)
NEUTROS PCT: 75 %
Platelets: 270 10*3/uL (ref 150–440)
RBC: 4.92 MIL/uL (ref 3.80–5.20)
RDW: 12.9 % (ref 11.5–14.5)
WBC: 9.2 10*3/uL (ref 3.6–11.0)

## 2017-04-12 MED ORDER — METHYLPREDNISOLONE SODIUM SUCC 125 MG IJ SOLR
125.0000 mg | Freq: Once | INTRAMUSCULAR | Status: AC
  Administered 2017-04-12: 125 mg via INTRAVENOUS
  Filled 2017-04-12: qty 2

## 2017-04-12 MED ORDER — IPRATROPIUM-ALBUTEROL 0.5-2.5 (3) MG/3ML IN SOLN
9.0000 mL | Freq: Once | RESPIRATORY_TRACT | Status: AC
  Administered 2017-04-12: 9 mL via RESPIRATORY_TRACT
  Filled 2017-04-12: qty 9

## 2017-04-12 MED ORDER — PREDNISONE 20 MG PO TABS: 60.0000 mg | ORAL_TABLET | Freq: Once | ORAL | Status: DC

## 2017-04-12 MED ORDER — PREDNISONE 20 MG PO TABS: 40.0000 mg | ORAL_TABLET | Freq: Every day | ORAL | 0 refills | Status: DC
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
## 2017-07-06 ENCOUNTER — Other Ambulatory Visit: Payer: Self-pay

## 2017-07-06 ENCOUNTER — Encounter: Payer: Self-pay | Admitting: Emergency Medicine

## 2017-07-06 ENCOUNTER — Emergency Department: Payer: Self-pay

## 2017-07-06 ENCOUNTER — Emergency Department
Admission: EM | Admit: 2017-07-06 | Discharge: 2017-07-06 | Disposition: A | Discharge status: Home / Self Care | Payer: Self-pay | Attending: Student in an Organized Health Care Education/Training Program | Admitting: Student in an Organized Health Care Education/Training Program

## 2017-07-06 DIAGNOSIS — O200 Threatened abortion: Secondary | ICD-10-CM | POA: Insufficient documentation

## 2017-07-06 DIAGNOSIS — J45909 Unspecified asthma, uncomplicated: Secondary | ICD-10-CM | POA: Insufficient documentation

## 2017-07-06 DIAGNOSIS — R102 Pelvic and perineal pain: Secondary | ICD-10-CM | POA: Insufficient documentation

## 2017-07-06 DIAGNOSIS — Z79899 Other long term (current) drug therapy: Secondary | ICD-10-CM | POA: Insufficient documentation

## 2017-07-06 DIAGNOSIS — Z3A00 Weeks of gestation of pregnancy not specified: Secondary | ICD-10-CM | POA: Insufficient documentation

## 2017-07-06 LAB — URINALYSIS, COMPLETE (UACMP) WITH MICROSCOPIC
Bacteria, UA: NONE SEEN
Bilirubin Urine: NEGATIVE
Glucose, UA: NEGATIVE mg/dL
KETONES UR: 20 mg/dL — AB
LEUKOCYTES UA: NEGATIVE
Nitrite: NEGATIVE
PH: 5 (ref 5.0–8.0)
PROTEIN: 30 mg/dL — AB
Specific Gravity, Urine: 1.029 (ref 1.005–1.030)

## 2017-07-06 LAB — CBC
HEMATOCRIT: 41.7 % (ref 35.0–47.0)
HEMOGLOBIN: 14.6 g/dL (ref 12.0–16.0)
MCH: 30.4 pg (ref 26.0–34.0)
MCHC: 34.9 g/dL (ref 32.0–36.0)
MCV: 86.9 fL (ref 80.0–100.0)
Platelets: 322 10*3/uL (ref 150–440)
RBC: 4.8 MIL/uL (ref 3.80–5.20)
RDW: 12.6 % (ref 11.5–14.5)
WBC: 5.9 10*3/uL (ref 3.6–11.0)

## 2017-07-06 LAB — COMPREHENSIVE METABOLIC PANEL
ALBUMIN: 4.7 g/dL (ref 3.5–5.0)
ALT: 15 U/L (ref 14–54)
ANION GAP: 9 (ref 5–15)
AST: 18 U/L (ref 15–41)
Alkaline Phosphatase: 46 U/L (ref 38–126)
BUN: 12 mg/dL (ref 6–20)
CALCIUM: 8.9 mg/dL (ref 8.9–10.3)
CHLORIDE: 105 mmol/L (ref 101–111)
CO2: 24 mmol/L (ref 22–32)
Creatinine, Ser: 0.75 mg/dL (ref 0.44–1.00)
GFR calc Af Amer: >60 mL/min (ref >60)
GFR calc non Af Amer: >60 mL/min (ref >60)
Glucose, Bld: 94 mg/dL (ref 65–99)
Potassium: 3.5 mmol/L (ref 3.5–5.1)
SODIUM: 138 mmol/L (ref 135–145)
Total Bilirubin: 1.3 mg/dL — ABNORMAL HIGH (ref 0.3–1.2)
Total Protein: 8 g/dL (ref 6.5–8.1)

## 2017-07-06 LAB — POCT PREGNANCY, URINE: Preg Test, Ur: POSITIVE — AB

## 2017-07-06 LAB — ABO/RH: ABO/RH(D): A NEG

## 2017-07-06 LAB — HCG, QUANTITATIVE, PREGNANCY: HCG, BETA CHAIN, QUANT, S: 380 m[IU]/mL — AB (ref <5)

## 2017-07-06 MED ORDER — ACETAMINOPHEN 500 MG PO TABS
ORAL_TABLET | ORAL | Status: AC
  Administered 2017-07-06: 1000 mg via ORAL
  Filled 2017-07-06: qty 2

## 2017-07-06 MED ORDER — ACETAMINOPHEN 500 MG PO TABS
1000.0000 mg | ORAL_TABLET | Freq: Once | ORAL | Status: AC
  Administered 2017-07-06: 1000 mg via ORAL

## 2017-07-06 MED ORDER — RHO D IMMUNE GLOBULIN 1500 UNIT/2ML IJ SOSY
300.0000 ug | PREFILLED_SYRINGE | Freq: Once | INTRAMUSCULAR | Status: AC
  Administered 2017-07-06: 300 ug via INTRAMUSCULAR
  Filled 2017-07-06: qty 2

## 2017-07-06 NOTE — ED Notes (Signed)
Per lab, they will add-on ABO/Rh to labwork collected in triage.

## 2017-07-06 NOTE — ED Triage Notes (Signed)
Abdominal pain x 6 days. Vaginal bleeding.

## 2017-07-06 NOTE — ED Notes (Addendum)
Pt ambulatory upon discharge. Verbalized understanding of discharge instructions and follow-up care. VSS. Skin warm and dry. A&O x4.

## 2017-07-06 NOTE — ED Notes (Signed)
Pt still in [place]

## 2017-07-06 NOTE — ED Provider Notes (Signed)
[facility] Emergency Department Provider Note    First MD Initiated Contact with Patient 07/06/17 1740     (approximate)  I have reviewed the triage vital signs and the nursing notes.   HISTORY  Chief Complaint Abdominal Pain    HPI Brenda Aguirre is a 22 y.o. female with first trimester vaginal bleeding as well as vaginal cramping.  Patient even brings picture of products that she passed that does appear consistent with products of conception.  States she is having some right-sided abdominal pain.  Has not had confirmatory ultrasound performed.  Denies any blood thinners.  No trauma.  States that the pain is crampy in nature and mild to moderate in severity.  Past Medical History:  Diagnosis Date  . Asthma    No family history on file. History reviewed. No pertinent surgical history. There are no active problems to display for this patient.     Prior to Admission medications   Medication Sig Start Date End Date Taking? Authorizing Provider  albuterol (PROVENTIL HFA;VENTOLIN HFA) 108 (90 Base) MCG/ACT inhaler Inhale 2 puffs into the lungs every 6 (six) hours as needed for wheezing or shortness of breath. 08/11/16   Johnn Hai, PA-C  albuterol (PROVENTIL) (2.5 MG/3ML) 0.083% nebulizer solution Take 3 mLs (2.5 mg total) by nebulization every 6 (six) hours as needed for wheezing or shortness of breath. 08/11/16   Johnn Hai, PA-C  polyethylene glycol (MIRALAX / GLYCOLAX) packet Take 17 g by mouth daily. 08/28/15   Earleen Newport, MD  predniSONE (DELTASONE) 20 MG tablet Take 2 tablets (40 mg total) by mouth daily with breakfast. 04/12/17   Clearnce Hasten, Randall An, MD    Allergies Patient has no known allergies.    Social History Social History   Tobacco Use  . Smoking status: Never Smoker  . Smokeless tobacco: Never Used  Substance Use Topics  . Alcohol use: No  . Drug use: No    Review of Systems Patient denies headaches,  rhinorrhea, blurry vision, numbness, shortness of breath, chest pain, edema, cough, abdominal pain, nausea, vomiting, diarrhea, dysuria, fevers, rashes or hallucinations unless otherwise stated above in HPI. ____________________________________________   PHYSICAL EXAM:  VITAL SIGNS: Vitals:   07/06/17 1624 07/06/17 2232  BP: (!) 134/92 94/78  Pulse: 92 (!) 59  Resp: 20 18  Temp: 98.2 F (36.8 C)   SpO2: 100% 98%    Constitutional: Alert and oriented. Well appearing and in no acute distress. Eyes: Conjunctivae are normal.  Head: Atraumatic. Nose: No congestion/rhinnorhea. Mouth/Throat: Mucous membranes are moist.   Neck: No stridor. Painless ROM.  Cardiovascular: Normal rate, regular rhythm. Grossly normal heart sounds.  Good peripheral circulation. Respiratory: Normal respiratory effort.  No retractions. Lungs CTAB. Gastrointestinal: Soft and nontender. No distention. No abdominal bruits. No CVA tenderness. Genitourinary: deferred Musculoskeletal: No lower extremity tenderness nor edema.  No joint effusions. Neurologic:  Normal speech and language. No gross focal neurologic deficits are appreciated. No facial droop Skin:  Skin is warm, dry and intact. No rash noted. Psychiatric: Mood and affect are normal. Speech and behavior are normal.  ____________________________________________   LABS (all labs ordered are listed, but only abnormal results are displayed)  Results for orders placed or performed during the hospital encounter of 07/06/17 (from the past 24 hour(s))  Pregnancy, urine POC     Status: Abnormal   Collection Time: 07/06/17  4:40 PM  Result Value Ref Range   Preg Test, Ur POSITIVE (A)  NEGATIVE  Comprehensive metabolic panel     Status: Abnormal   Collection Time: 07/06/17  4:58 PM  Result Value Ref Range   Sodium 138 135 - 145 mmol/L   Potassium 3.5 3.5 - 5.1 mmol/L   Chloride 105 101 - 111 mmol/L   CO2 24 22 - 32 mmol/L   Glucose, Bld 94 65 - 99 mg/dL    BUN 12 6 - 20 mg/dL   Creatinine, Ser 0.75 0.44 - 1.00 mg/dL   Calcium 8.9 8.9 - 10.3 mg/dL   Total Protein 8.0 6.5 - 8.1 g/dL   Albumin 4.7 3.5 - 5.0 g/dL   AST 18 15 - 41 U/L   ALT 15 14 - 54 U/L   Alkaline Phosphatase 46 38 - 126 U/L   Total Bilirubin 1.3 (H) 0.3 - 1.2 mg/dL   GFR calc non Af Amer >60 >60 mL/min   GFR calc Af Amer >60 >60 mL/min   Anion gap 9 5 - 15  CBC     Status: None   Collection Time: 07/06/17  4:58 PM  Result Value Ref Range   WBC 5.9 3.6 - 11.0 K/uL   RBC 4.80 3.80 - 5.20 MIL/uL   Hemoglobin 14.6 12.0 - 16.0 g/dL   HCT 41.7 35.0 - 47.0 %   MCV 86.9 80.0 - 100.0 fL   MCH 30.4 26.0 - 34.0 pg   MCHC 34.9 32.0 - 36.0 g/dL   RDW 12.6 11.5 - 14.5 %   Platelets 322 150 - 440 K/uL  hCG, quantitative, pregnancy     Status: Abnormal   Collection Time: 07/06/17  4:58 PM  Result Value Ref Range   hCG, Beta Chain, Quant, S 380 (H) <5 mIU/mL  Urinalysis, Complete w Microscopic     Status: Abnormal   Collection Time: 07/06/17  4:58 PM  Result Value Ref Range   Color, Urine YELLOW (A) YELLOW   APPearance HAZY (A) CLEAR   Specific Gravity, Urine 1.029 1.005 - 1.030   pH 5.0 5.0 - 8.0   Glucose, UA NEGATIVE NEGATIVE mg/dL   Hgb urine dipstick LARGE (A) NEGATIVE   Bilirubin Urine NEGATIVE NEGATIVE   Ketones, ur 20 (A) NEGATIVE mg/dL   Protein, ur 30 (A) NEGATIVE mg/dL   Nitrite NEGATIVE NEGATIVE   Leukocytes, UA NEGATIVE NEGATIVE   RBC / HPF TOO NUMEROUS TO COUNT 0 - 5 RBC/hpf   WBC, UA 0-5 0 - 5 WBC/hpf   Bacteria, UA NONE SEEN NONE SEEN   Squamous Epithelial / LPF 0-5 (A) NONE SEEN   Mucus PRESENT   ABO/Rh     Status: None   Collection Time: 07/06/17  4:58 PM  Result Value Ref Range   ABO/RH(D)      A NEG Performed at [facility], [address] [place], [place] [address]   Antibody screen     Status: None   Collection Time: 07/06/17  4:58 PM  Result Value Ref Range   Antibody Screen      NEG Performed at [facility], [address] [place], [place] [address]   Rhogam injection     Status: None (Preliminary result)   Collection Time: 07/06/17  9:39 PM  Result Value Ref Range   Unit Number [id number]    Blood Component Type RHIG    Unit division 00    Status of Unit ISSUED    Transfusion Status      OK TO TRANSFUSE Performed at [facility],  [place], [place] [address]    ____________________________________________ ____________________________________________  RADIOLOGY  I personally reviewed all radiographic images ordered to evaluate for the above acute complaints and reviewed radiology reports and findings.  These findings were personally discussed with the patient.  Please see medical record for radiology report.  ____________________________________________   PROCEDURES  Procedure(s) performed:  Procedures    Critical Care performed: no ____________________________________________   INITIAL IMPRESSION / ASSESSMENT AND PLAN / ED COURSE  Pertinent labs & imaging results that were available during my care of the patient were reviewed by me and considered in my medical decision making (see chart for details).  DDX: Ectopic, threatened miscarriage, incomplete miscarriage  Brenda Aguirre is a 22 y.o. who presents to the ED with symptoms as described above.  She is hemodynamically stable.  Presentation is most clinically consistent with threatened or incomplete miscarriage.  Beta quant is low to 300.  Ultrasound more suggestive pedunculated fibroid.  Discussed that will need 48-hour repeat hCG quant level to rule out ectopic.  Patient has followed up in OB and agrees with plan.  She also demonstrates understanding that she will return to the ER in 48 hours for repeat hCG testing if unable to get in the clinic.  Based on the picture that she shows I am highly suspicious of miscarriage.  Patient given RhoGam as she is Rh-.  Have discussed with the patient and  available family all diagnostics and treatments performed thus far and all questions were answered to the best of my ability. The patient demonstrates understanding and agreement with plan.       As part of my medical decision making, I reviewed the following data within the [facility] notes reviewed and incorporated, Labs reviewed, notes from prior ED visits.  ____________________________________________   FINAL CLINICAL IMPRESSION(S) / ED DIAGNOSES  Final diagnoses:  Threatened miscarriage in early pregnancy      NEW MEDICATIONS STARTED DURING THIS VISIT:  Discharge Medication List as of 07/06/2017  8:26 PM       Note:  This document was prepared using Dragon voice recognition software and may include unintentional dictation errors.    Merlyn Lot, MD 07/06/17 [phone]

## 2017-07-06 NOTE — Discharge Instructions (Addendum)
Please follow-up with OB/GYN in 2-3 days for repeat hCG level check.  Return to the ER if unable to get in the clinic.  Return for worsening pain, fevers, weakness or for any additional questions or concerns.

## 2017-07-06 NOTE — ED Notes (Signed)
Holding pt until at least 2230 s/p IM injection.

## 2017-07-06 NOTE — ED Notes (Addendum)
Pt in [place], will assess when returns

## 2017-07-07 LAB — ANTIBODY SCREEN: Antibody Screen: NEGATIVE

## 2017-07-08 LAB — RHOGAM INJECTION: Unit division: 0

## 2017-11-25 LAB — HM PAP SMEAR: HM Pap smear: NEGATIVE

## 2017-12-13 ENCOUNTER — Other Ambulatory Visit: Payer: Self-pay

## 2017-12-13 ENCOUNTER — Emergency Department
Admission: EM | Admit: 2017-12-13 | Discharge: 2017-12-13 | Disposition: A | Discharge status: Home / Self Care | Payer: Self-pay | Attending: Emergency Medicine | Admitting: Emergency Medicine

## 2017-12-13 DIAGNOSIS — Y9389 Activity, other specified: Secondary | ICD-10-CM | POA: Insufficient documentation

## 2017-12-13 DIAGNOSIS — X58XXXA Exposure to other specified factors, initial encounter: Secondary | ICD-10-CM | POA: Insufficient documentation

## 2017-12-13 DIAGNOSIS — T192XXA Foreign body in vulva and vagina, initial encounter: Secondary | ICD-10-CM | POA: Insufficient documentation

## 2017-12-13 DIAGNOSIS — Y929 Unspecified place or not applicable: Secondary | ICD-10-CM | POA: Insufficient documentation

## 2017-12-13 DIAGNOSIS — Y999 Unspecified external cause status: Secondary | ICD-10-CM | POA: Insufficient documentation

## 2017-12-13 DIAGNOSIS — N760 Acute vaginitis: Secondary | ICD-10-CM | POA: Insufficient documentation

## 2017-12-13 DIAGNOSIS — B9689 Other specified bacterial agents as the cause of diseases classified elsewhere: Secondary | ICD-10-CM | POA: Insufficient documentation

## 2017-12-13 LAB — URINALYSIS, COMPLETE (UACMP) WITH MICROSCOPIC
BILIRUBIN URINE: NEGATIVE
Glucose, UA: NEGATIVE mg/dL
Hgb urine dipstick: NEGATIVE
Ketones, ur: NEGATIVE mg/dL
LEUKOCYTES UA: NEGATIVE
Nitrite: NEGATIVE
Protein, ur: 30 mg/dL — AB
SPECIFIC GRAVITY, URINE: 1.027 (ref 1.005–1.030)
pH: 7 (ref 5.0–8.0)

## 2017-12-13 LAB — CBC WITH DIFFERENTIAL/PLATELET
BASOS ABS: 0.1 10*3/uL (ref 0–0.1)
Basophils Relative: 1 %
EOS ABS: 0.2 10*3/uL (ref 0–0.7)
EOS PCT: 3 %
HCT: 38.6 % (ref 35.0–47.0)
Hemoglobin: 14.1 g/dL (ref 12.0–16.0)
Lymphocytes Relative: 44 %
Lymphs Abs: 3.6 10*3/uL (ref 1.0–3.6)
MCH: 31.1 pg (ref 26.0–34.0)
MCHC: 36.5 g/dL — ABNORMAL HIGH (ref 32.0–36.0)
MCV: 85.2 fL (ref 80.0–100.0)
Monocytes Absolute: 0.7 10*3/uL (ref 0.2–0.9)
Monocytes Relative: 8 %
Neutro Abs: 3.7 10*3/uL (ref 1.4–6.5)
Neutrophils Relative %: 44 %
PLATELETS: 330 10*3/uL (ref 150–440)
RBC: 4.53 MIL/uL (ref 3.80–5.20)
RDW: 13 % (ref 11.5–14.5)
WBC: 8.3 10*3/uL (ref 3.6–11.0)

## 2017-12-13 LAB — COMPREHENSIVE METABOLIC PANEL
ALBUMIN: 4.2 g/dL (ref 3.5–5.0)
ALT: 14 U/L (ref 0–44)
AST: 17 U/L (ref 15–41)
Alkaline Phosphatase: 36 U/L — ABNORMAL LOW (ref 38–126)
Anion gap: 9 (ref 5–15)
BUN: 14 mg/dL (ref 6–20)
CHLORIDE: 109 mmol/L (ref 98–111)
CO2: 23 mmol/L (ref 22–32)
CREATININE: 0.74 mg/dL (ref 0.44–1.00)
Calcium: 9.2 mg/dL (ref 8.9–10.3)
GFR calc Af Amer: >60 mL/min (ref >60)
GFR calc non Af Amer: >60 mL/min (ref >60)
Glucose, Bld: 89 mg/dL (ref 70–99)
Potassium: 3.7 mmol/L (ref 3.5–5.1)
SODIUM: 141 mmol/L (ref 135–145)
Total Bilirubin: 1.1 mg/dL (ref 0.3–1.2)
Total Protein: 7.6 g/dL (ref 6.5–8.1)

## 2017-12-13 LAB — WET PREP, GENITAL
SPERM: NONE SEEN
Trich, Wet Prep: NONE SEEN
Yeast Wet Prep HPF POC: NONE SEEN

## 2017-12-13 LAB — CHLAMYDIA/NGC RT PCR (ARMC ONLY)
Chlamydia Tr: NOT DETECTED
N gonorrhoeae: NOT DETECTED

## 2017-12-13 LAB — PREGNANCY, URINE: Preg Test, Ur: NEGATIVE

## 2017-12-13 MED ORDER — FLUCONAZOLE 150 MG PO TABS: 150.0000 mg | ORAL_TABLET | Freq: Once | ORAL | 0 refills | Status: AC

## 2017-12-13 MED ORDER — METRONIDAZOLE 500 MG PO TABS: 500.0000 mg | ORAL_TABLET | Freq: Two times a day (BID) | ORAL | 0 refills | Status: DC

## 2017-12-13 NOTE — ED Triage Notes (Signed)
Patient reports thinks may have left a tampon in for approximately a week.  Patient reports noticed an odor and pelvic cramping.

## 2017-12-13 NOTE — ED Notes (Signed)
Pelvic cart set up at bedside  

## 2017-12-13 NOTE — ED Provider Notes (Signed)
[facility] Emergency Department Provider Note  ____________________________________________  Time seen: Approximately 9:21 PM  I have reviewed the triage vital signs and the nursing notes.   HISTORY  Chief Complaint Foreign Body in Vagina    HPI Brenda Aguirre is a 22 y.o. female who presents the emergency department complaining of possible retained tampon.  Patient reports that last weekend, week ago she was partying, and believes that she inserted a tampon, prior to removing same she inserted another tampon.  Patient reports that she has had some abdominal cramping, foul odor from her vagina for the past several days.  She reports some continued bleeding past her normal period.  She is unsure whether this was from retained foreign body or whether this was abnormal uterine bleeding due to starting a new birth control method.  She denies any fevers or chills, frank abdominal pain, dysuria, hematuria.  No other complaints at this time.  Patient saw her OB/GYN 2 weeks prior.    Past Medical History:  Diagnosis Date  . Asthma     There are no active problems to display for this patient.   No past surgical history on file.  Prior to Admission medications   Medication Sig Start Date End Date Taking? Authorizing Provider  albuterol (PROVENTIL HFA;VENTOLIN HFA) 108 (90 Base) MCG/ACT inhaler Inhale 2 puffs into the lungs every 6 (six) hours as needed for wheezing or shortness of breath. 08/11/16   Johnn Hai, PA-C  albuterol (PROVENTIL) (2.5 MG/3ML) 0.083% nebulizer solution Take 3 mLs (2.5 mg total) by nebulization every 6 (six) hours as needed for wheezing or shortness of breath. 08/11/16   Johnn Hai, PA-C  fluconazole (DIFLUCAN) 150 MG tablet Take 1 tablet (150 mg total) by mouth once for 1 dose. Take after finishing antibiotics. 12/13/17 12/13/17  Cuthriell, Charline Bills, PA-C  metroNIDAZOLE (FLAGYL) 500 MG tablet Take 1 tablet (500 mg total) by mouth 2  (two) times daily. 12/13/17   Cuthriell, Charline Bills, PA-C  polyethylene glycol (MIRALAX / GLYCOLAX) packet Take 17 g by mouth daily. 08/28/15   Earleen Newport, MD  predniSONE (DELTASONE) 20 MG tablet Take 2 tablets (40 mg total) by mouth daily with breakfast. 04/12/17   Clearnce Hasten, Randall An, MD    Allergies Patient has no known allergies.  No family history on file.  Social History Social History   Tobacco Use  . Smoking status: Never Smoker  . Smokeless tobacco: Never Used  Substance Use Topics  . Alcohol use: No  . Drug use: No     Review of Systems  Constitutional: No fever/chills Eyes: No visual changes. No discharge ENT: No upper respiratory complaints. Cardiovascular: no chest pain. Respiratory: no cough. No SOB. Gastrointestinal: No abdominal pain.  No nausea, no vomiting.  No diarrhea.  No constipation. Genitourinary: Negative for dysuria. No hematuria.  Positive for vaginal odor.  Positive for possible foreign body in the vagina. Musculoskeletal: Negative for musculoskeletal pain. Skin: Negative for rash, abrasions, lacerations, ecchymosis. Neurological: Negative for headaches, focal weakness or numbness. 10-point ROS otherwise negative.  ____________________________________________   PHYSICAL EXAM:  VITAL SIGNS: ED Triage Vitals  Enc Vitals Group     BP 12/13/17 2100 139/89     Pulse Rate 12/13/17 2100 85     Resp 12/13/17 2100 16     Temp --      Temp src --      SpO2 12/13/17 2100 98 %     Weight 12/13/17 2057  165 lb (74.8 kg)     Height 12/13/17 2057 5' (1.524 m)     Head Circumference --      Peak Flow --      Pain Score 12/13/17 2056 3     Pain Loc --      Pain Edu? --      Excl. in [place]? --      Constitutional: Alert and oriented. Well appearing and in no acute distress. Eyes: Conjunctivae are normal. PERRL. EOMI. Head: Atraumatic. ENT:      Ears:       Nose: No congestion/rhinnorhea.      Mouth/Throat: Mucous membranes are moist.   Neck: No stridor.    Cardiovascular: Normal rate, regular rhythm. Normal S1 and S2.  Good peripheral circulation. Respiratory: Normal respiratory effort without tachypnea or retractions. Lungs CTAB. Good air entry to the bases with no decreased or absent breath sounds. Gastrointestinal: Bowel sounds 4 quadrants. Soft and nontender to palpation. No guarding or rigidity. No palpable masses. No distention. No CVA tenderness. Genitourinary: Visualization of external genitalia reveals no lesions, chancres, discharge, bleeding.  Speculum inserted.  In the distal vaginal canal, the tampon is visualized.  Using McGill forceps, this is successfully removed.  Cervix is edematous but not grossly erythematous.  No vaginal discharge is appreciated in the vaginal vault or canal.  No blood in the vaginal vault.  After removal of foreign body, bimanual exam reveals no palpable abnormality to the cervix, adnexa.  No cervical motion tenderness. Musculoskeletal: Full range of motion to all extremities. No gross deformities appreciated. Neurologic:  Normal speech and language. No gross focal neurologic deficits are appreciated.  Skin:  Skin is warm, dry and intact. No rash noted. Psychiatric: Mood and affect are normal. Speech and behavior are normal. Patient exhibits appropriate insight and judgement.   ____________________________________________   LABS (all labs ordered are listed, but only abnormal results are displayed)  Labs Reviewed  WET PREP, GENITAL - Abnormal; Notable for the following components:      Result Value   Clue Cells Wet Prep HPF POC PRESENT (*)    WBC, Wet Prep HPF POC FEW (*)    All other components within normal limits  URINALYSIS, COMPLETE (UACMP) WITH MICROSCOPIC - Abnormal; Notable for the following components:   Color, Urine YELLOW (*)    APPearance CLEAR (*)    Protein, ur 30 (*)    Bacteria, UA RARE (*)    All other components within normal limits  CBC WITH  DIFFERENTIAL/PLATELET - Abnormal; Notable for the following components:   MCHC 36.5 (*)    All other components within normal limits  COMPREHENSIVE METABOLIC PANEL - Abnormal; Notable for the following components:   Alkaline Phosphatase 36 (*)    All other components within normal limits  CHLAMYDIA/NGC RT PCR ([facility] ONLY)  PREGNANCY, URINE   ____________________________________________  EKG   ____________________________________________  RADIOLOGY   No results found.  ____________________________________________    PROCEDURES  Procedure(s) performed:    Procedures    Medications - No data to display   ____________________________________________   INITIAL IMPRESSION / ASSESSMENT AND PLAN / ED COURSE  Pertinent labs & imaging results that were available during my care of the patient were reviewed by me and considered in my medical decision making (see chart for details).  Review of the [place] CSRS was performed in accordance of the [place] prior to dispensing any controlled drugs.  Clinical Course as of Dec 14 2219  Sun Dec 13, 2017  2125 Patient presented to the emergency department with possible foreign body in the vagina.  Patient reports that she may have left a tampon in for greater than a week.  On exam, patient did have a retained foreign body consistent with retained tampon.  Wet prep, gonorrhea chlamydia swabs are obtained.  Urinalysis and pregnancy test will also be obtained.  Given nature of complaint, CBC and CMP are also ordered.  At this time, no indication of toxic shock syndrome.  No indication on exam of pelvic inflammatory disease.  Differential includes UTI, BV, gonorrhea and chlamydia, tract, toxic shock syndrome, PID.   [JC]    Clinical Course User Index [JC] Cuthriell, Charline Bills, PA-C     Patient's diagnosis is consistent with retained foreign body in the vagina, bacterial vaginosis.  Patient presented to the emergency department  complaining of pelvic cramping, possible retained foreign body.  Patient did have a retained tampon that had been in greater than 1 week.  Overall, exam was otherwise reassuring.  No cervical motion tenderness.  No tenderness to palpation of the bilateral adnexa, uterus.  Patient did have clue cells on wet prep, however work-up was otherwise reassuring.  At this time, patient will be treated for bacterial vaginosis.  No indication for IV antibiotics, or admission.  No indication for further work-up.  I did discuss toxic shock syndrome with the patient and gave return precautions for any symptoms that might develop.  Patient verbalizes understanding of same.. Patient will be discharged home with prescriptions for Flagyl. Patient is to follow up with OB/GYN as needed or otherwise directed. Patient is given ED precautions to return to the ED for any worsening or new symptoms.     ____________________________________________  FINAL CLINICAL IMPRESSION(S) / ED DIAGNOSES  Final diagnoses:  Foreign body in vagina, initial encounter  Bacterial vaginosis      NEW MEDICATIONS STARTED DURING THIS VISIT:  ED Discharge Orders         Ordered    metroNIDAZOLE (FLAGYL) 500 MG tablet  2 times daily     12/13/17 2218    fluconazole (DIFLUCAN) 150 MG tablet   Once     12/13/17 2218              This chart was dictated using voice recognition software/Dragon. Despite best efforts to proofread, errors can occur which can change the meaning. Any change was purely unintentional.    Darletta Moll, PA-C 12/13/17 2221    Nena Polio, MD 12/14/17 [phone]

## 2018-01-06 DIAGNOSIS — E663 Overweight: Secondary | ICD-10-CM | POA: Insufficient documentation

## 2018-01-06 DIAGNOSIS — E669 Obesity, unspecified: Secondary | ICD-10-CM | POA: Insufficient documentation

## 2018-01-24 ENCOUNTER — Emergency Department
Admission: EM | Admit: 2018-01-24 | Discharge: 2018-01-25 | Disposition: A | Discharge status: Home / Self Care | Payer: Self-pay | Attending: Emergency Medicine | Admitting: Emergency Medicine

## 2018-01-24 ENCOUNTER — Encounter: Payer: Self-pay | Admitting: Emergency Medicine

## 2018-01-24 DIAGNOSIS — Z79899 Other long term (current) drug therapy: Secondary | ICD-10-CM | POA: Insufficient documentation

## 2018-01-24 DIAGNOSIS — J45909 Unspecified asthma, uncomplicated: Secondary | ICD-10-CM | POA: Insufficient documentation

## 2018-01-24 DIAGNOSIS — N3000 Acute cystitis without hematuria: Secondary | ICD-10-CM | POA: Insufficient documentation

## 2018-01-24 DIAGNOSIS — F17210 Nicotine dependence, cigarettes, uncomplicated: Secondary | ICD-10-CM | POA: Insufficient documentation

## 2018-01-24 LAB — URINALYSIS, COMPLETE (UACMP) WITH MICROSCOPIC
BILIRUBIN URINE: NEGATIVE
Bacteria, UA: NONE SEEN
GLUCOSE, UA: NEGATIVE mg/dL
Nitrite: NEGATIVE
Protein, ur: NEGATIVE mg/dL
Specific Gravity, Urine: >1.03 — ABNORMAL HIGH (ref 1.005–1.030)
pH: 5.5 (ref 5.0–8.0)

## 2018-01-24 MED ORDER — CEPHALEXIN 500 MG PO CAPS
500.0000 mg | ORAL_CAPSULE | Freq: Once | ORAL | Status: AC
  Administered 2018-01-25: 500 mg via ORAL
  Filled 2018-01-24: qty 1

## 2018-01-24 NOTE — ED Provider Notes (Signed)
[facility] Emergency Department Provider Note  ____________________________________________  Time seen: Approximately 11:18 PM  I have reviewed the triage vital signs and the nursing notes.   HISTORY  Chief Complaint Urinary Tract Infection    HPI Brenda Aguirre is a 22 y.o. female that presents to emergency department for evaluation of dysuria and suprapubic discomfort tonight.  Patient was tested for STDs 1 month ago.  She was diagnosed with bacterial vaginitis and finished a course of metronidazole 2 weeks ago.  STD results were negative.  She is currently on her menstrual cycle.  She has never had a kidney stone.  No fever, chills, nausea, vomiting, abdominal pain, back pain, vaginal discharge, vaginal pain.   Past Medical History:  Diagnosis Date  . Asthma     There are no active problems to display for this patient.   History reviewed. No pertinent surgical history.  Prior to Admission medications   Medication Sig Start Date End Date Taking? Authorizing Provider  albuterol (PROVENTIL HFA;VENTOLIN HFA) 108 (90 Base) MCG/ACT inhaler Inhale 2 puffs into the lungs every 6 (six) hours as needed for wheezing or shortness of breath. 08/11/16   Johnn Hai, PA-C  albuterol (PROVENTIL) (2.5 MG/3ML) 0.083% nebulizer solution Take 3 mLs (2.5 mg total) by nebulization every 6 (six) hours as needed for wheezing or shortness of breath. 08/11/16   Johnn Hai, PA-C  metroNIDAZOLE (FLAGYL) 500 MG tablet Take 1 tablet (500 mg total) by mouth 2 (two) times daily. 12/13/17   Cuthriell, Charline Bills, PA-C  polyethylene glycol (MIRALAX / GLYCOLAX) packet Take 17 g by mouth daily. 08/28/15   Earleen Newport, MD  predniSONE (DELTASONE) 20 MG tablet Take 2 tablets (40 mg total) by mouth daily with breakfast. 04/12/17   Clearnce Hasten, Randall An, MD    Allergies Patient has no known allergies.  No family history on file.  Social History Social History    Tobacco Use  . Smoking status: Current Some Day Smoker  . Smokeless tobacco: Never Used  Substance Use Topics  . Alcohol use: Yes  . Drug use: No     Review of Systems  Constitutional: No fever/chills Cardiovascular: No chest pain. Respiratory:  No SOB. Gastrointestinal: Positive for suprapubic discomfort.  No nausea, no vomiting.  Genitourinary: Positive for dysuria. Musculoskeletal: Negative for musculoskeletal pain. Skin: Negative for rash, abrasions, lacerations, ecchymosis. Neurological: Negative for headaches, numbness or tingling   ____________________________________________   PHYSICAL EXAM:  VITAL SIGNS: ED Triage Vitals  Enc Vitals Group     BP 01/24/18 2232 (!) 142/80     Pulse Rate 01/24/18 2232 (!) 102     Resp 01/24/18 2232 18     Temp 01/24/18 2232 98.5 F (36.9 C)     Temp Source 01/24/18 2232 Oral     SpO2 01/24/18 2232 98 %     Weight 01/24/18 2233 165 lb (74.8 kg)     Height 01/24/18 2233 5' (1.524 m)     Head Circumference --      Peak Flow --      Pain Score 01/24/18 2233 2     Pain Loc --      Pain Edu? --      Excl. in [place]? --      Constitutional: Alert and oriented. Well appearing and in no acute distress. Eyes: Conjunctivae are normal. PERRL. EOMI. Head: Atraumatic. ENT:      Ears:      Nose: No congestion/rhinnorhea.  Mouth/Throat: Mucous membranes are moist.  Neck: No stridor.  Cardiovascular: Normal rate, regular rhythm.  Good peripheral circulation. Respiratory: Normal respiratory effort without tachypnea or retractions. Lungs CTAB. Good air entry to the bases with no decreased or absent breath sounds. Gastrointestinal: Bowel sounds 4 quadrants. Soft and nontender to palpation. No guarding or rigidity. No palpable masses. No distention. No CVA tenderness. Musculoskeletal: Full range of motion to all extremities. No gross deformities appreciated. Neurologic:  Normal speech and language. No gross focal neurologic deficits  are appreciated.  Skin:  Skin is warm, dry and intact. No rash noted. Psychiatric: Mood and affect are normal. Speech and behavior are normal. Patient exhibits appropriate insight and judgement.   ____________________________________________   LABS (all labs ordered are listed, but only abnormal results are displayed)  Labs Reviewed  URINALYSIS, COMPLETE (UACMP) WITH MICROSCOPIC - Abnormal; Notable for the following components:      Result Value   Color, Urine YELLOW (*)    APPearance CLEAR (*)    Specific Gravity, Urine >1.030 (*)    Hgb urine dipstick MODERATE (*)    Ketones, ur TRACE (*)    Leukocytes, UA TRACE (*)    All other components within normal limits   ____________________________________________  EKG   ____________________________________________  RADIOLOGY   No results found.  ____________________________________________    PROCEDURES  Procedure(s) performed:    Procedures    Medications - No data to display   ____________________________________________   INITIAL IMPRESSION / ASSESSMENT AND PLAN / ED COURSE  Pertinent labs & imaging results that were available during my care of the patient were reviewed by me and considered in my medical decision making (see chart for details).  Review of the [place] CSRS was performed in accordance of the [place] prior to dispensing any controlled drugs.   Patient's diagnosis is consistent with early urinary tract infection. Patient will be discharged home with prescriptions for keflex. Patient is to follow up with PCP as directed. Patient is given ED precautions to return to the ED for any worsening or new symptoms.   ____________________________________________  FINAL CLINICAL IMPRESSION(S) / ED DIAGNOSES  Final diagnoses:  Acute cystitis without hematuria      NEW MEDICATIONS STARTED DURING THIS VISIT:  ED Discharge Orders    None          This chart was dictated using voice recognition  software/Dragon. Despite best efforts to proofread, errors can occur which can change the meaning. Any change was purely unintentional.    Laban Emperor, PA-C 01/25/18 0002    Earleen Newport, MD 01/29/18 1356

## 2018-01-24 NOTE — ED Notes (Signed)
Patient unable to provide urine specimen at this time.

## 2018-01-24 NOTE — ED Triage Notes (Signed)
Patient states that she has a UTI. Patient with complaint of burning with urination times 3-4 days.

## 2018-01-25 MED ORDER — CEPHALEXIN 500 MG PO CAPS: 500.0000 mg | ORAL_CAPSULE | Freq: Two times a day (BID) | ORAL | 0 refills | Status: AC

## 2018-01-26 ENCOUNTER — Encounter: Payer: Self-pay | Admitting: Emergency Medicine

## 2018-01-26 ENCOUNTER — Emergency Department: Payer: Self-pay

## 2018-01-26 ENCOUNTER — Other Ambulatory Visit: Payer: Self-pay

## 2018-01-26 ENCOUNTER — Emergency Department
Admission: EM | Admit: 2018-01-26 | Discharge: 2018-01-26 | Disposition: A | Discharge status: Home / Self Care | Payer: Self-pay | Attending: Emergency Medicine | Admitting: Emergency Medicine

## 2018-01-26 DIAGNOSIS — Z79899 Other long term (current) drug therapy: Secondary | ICD-10-CM | POA: Insufficient documentation

## 2018-01-26 DIAGNOSIS — F17200 Nicotine dependence, unspecified, uncomplicated: Secondary | ICD-10-CM | POA: Insufficient documentation

## 2018-01-26 DIAGNOSIS — J40 Bronchitis, not specified as acute or chronic: Secondary | ICD-10-CM | POA: Insufficient documentation

## 2018-01-26 MED ORDER — PSEUDOEPH-BROMPHEN-DM 30-2-10 MG/5ML PO SYRP: 5.0000 mL | ORAL_SOLUTION | Freq: Four times a day (QID) | ORAL | 0 refills | Status: DC | PRN

## 2018-01-26 MED ORDER — ALBUTEROL SULFATE (2.5 MG/3ML) 0.083% IN NEBU: 5.0000 mg | INHALATION_SOLUTION | Freq: Once | RESPIRATORY_TRACT | Status: DC

## 2018-01-26 MED ORDER — METHYLPREDNISOLONE 4 MG PO TBPK: ORAL_TABLET | ORAL | 0 refills | Status: DC

## 2018-01-26 MED ORDER — IPRATROPIUM-ALBUTEROL 0.5-2.5 (3) MG/3ML IN SOLN
3.0000 mL | Freq: Once | RESPIRATORY_TRACT | Status: AC
  Administered 2018-01-26: 3 mL via RESPIRATORY_TRACT
  Filled 2018-01-26: qty 3

## 2018-01-26 NOTE — ED Triage Notes (Signed)
Pt states asthma increasing over the past few days, has used inhaler with little relief. States pain on the left side of her chest from cough and asthma. Concerned the dust in her house is causing flare up. NAD.

## 2018-01-26 NOTE — ED Notes (Signed)
First Nurse Note: Patient complaining of chest pain since last PM.  Hx of asthma.  States pain comes and goes.  Denies pain on inspiration.

## 2018-01-26 NOTE — ED Provider Notes (Addendum)
[facility] Emergency Department Provider Note   ____________________________________________   None    (approximate)  I have reviewed the triage vital signs and the nursing notes.   HISTORY  Chief Complaint Asthma and Cough    HPI Brenda Aguirre is a 22 y.o. female patient presents with cough and wheeze for the past few days.  Patient had no relief with inhaler.  Patient also complain of left chest wall pain which she believes is from coughing.  Patient believes flareup is secondary to increased dust in her house.   Past Medical History:  Diagnosis Date  . Asthma     There are no active problems to display for this patient.   History reviewed. No pertinent surgical history.  Prior to Admission medications   Medication Sig Start Date End Date Taking? Authorizing Provider  albuterol (PROVENTIL HFA;VENTOLIN HFA) 108 (90 Base) MCG/ACT inhaler Inhale 2 puffs into the lungs every 6 (six) hours as needed for wheezing or shortness of breath. 08/11/16   Johnn Hai, PA-C  albuterol (PROVENTIL) (2.5 MG/3ML) 0.083% nebulizer solution Take 3 mLs (2.5 mg total) by nebulization every 6 (six) hours as needed for wheezing or shortness of breath. 08/11/16   Johnn Hai, PA-C  brompheniramine-pseudoephedrine-DM 30-2-10 MG/5ML syrup Take 5 mLs by mouth 4 (four) times daily as needed. 01/26/18   Sable Feil, PA-C  cephALEXin (KEFLEX) 500 MG capsule Take 1 capsule (500 mg total) by mouth 2 (two) times daily for 10 days. 01/25/18 02/04/18  Laban Emperor, PA-C  methylPREDNISolone (MEDROL DOSEPAK) 4 MG TBPK tablet Take Tapered dose as directed 01/26/18   Sable Feil, PA-C  metroNIDAZOLE (FLAGYL) 500 MG tablet Take 1 tablet (500 mg total) by mouth 2 (two) times daily. 12/13/17   Cuthriell, Charline Bills, PA-C  polyethylene glycol (MIRALAX / GLYCOLAX) packet Take 17 g by mouth daily. 08/28/15   Earleen Newport, MD  predniSONE (DELTASONE) 20 MG tablet Take 2  tablets (40 mg total) by mouth daily with breakfast. 04/12/17   Clearnce Hasten, Randall An, MD    Allergies Patient has no known allergies.  No family history on file.  Social History Social History   Tobacco Use  . Smoking status: Current Some Day Smoker  . Smokeless tobacco: Never Used  Substance Use Topics  . Alcohol use: Yes  . Drug use: No    Review of Systems Constitutional: No fever/chills Eyes: No visual changes. ENT: No sore throat. Cardiovascular: Denies chest pain. Respiratory: Cough and wheezing. Gastrointestinal: No abdominal pain.  No nausea, no vomiting.  No diarrhea.  No constipation. Genitourinary: Negative for dysuria. Musculoskeletal: Left chest wall pain. Skin: Negative for rash. Neurological: Negative for headaches, focal weakness or numbness.   ____________________________________________   PHYSICAL EXAM:  VITAL SIGNS: ED Triage Vitals  Enc Vitals Group     BP 01/26/18 0840 132/66     Pulse Rate 01/26/18 0840 86     Resp 01/26/18 0840 18     Temp 01/26/18 0840 98.5 F (36.9 C)     Temp Source 01/26/18 0840 Oral     SpO2 01/26/18 0840 96 %     Weight 01/26/18 0837 165 lb (74.8 kg)     Height 01/26/18 0837 5' (1.524 m)     Head Circumference --      Peak Flow --      Pain Score 01/26/18 0837 0     Pain Loc --      Pain Edu? --  Excl. in [place]? --    Constitutional: Alert and oriented. Well appearing and in no acute distress. Nose: No congestion/rhinnorhea. Mouth/Throat: Mucous membranes are moist.  Oropharynx non-erythematous. Neck: No stridor.  Cardiovascular: Normal rate, regular rhythm. Grossly normal heart sounds.  Good peripheral circulation. Respiratory: Normal respiratory effort.  No retractions. Lungs with left wheezing.   Neurologic:  Normal speech and language. No gross focal neurologic deficits are appreciated. No gait instability. Skin:  Skin is warm, dry and intact. No rash noted. Psychiatric: Mood and affect are normal.  Speech and behavior are normal.  ____________________________________________   LABS (all labs ordered are listed, but only abnormal results are displayed)  Labs Reviewed - No data to display ____________________________________________  EKG EKG read by heart station ducted with no acute findings.  ____________________________________________  RADIOLOGY  ED MD interpretation:    Official radiology report(s): Dg Chest 2 View  Result Date: 01/26/2018 CLINICAL DATA:  Cough, wheezing, rhonchi heard over the left lung, some shortness of breath, occasional smoking history EXAM: CHEST - 2 VIEW COMPARISON:  Chest x-ray of 04/12/2017 FINDINGS: No active infiltrate or effusion is seen. There may be mild peribronchial thickening which can be seen with asthma or bronchitis. Mediastinal and hilar contours are unremarkable. The heart is within normal limits in size. No bony abnormality is seen. IMPRESSION: 1. No active lung disease. 2. Mild peribronchial thickening may indicate bronchitis. Electronically Signed   By: Ivar Drape M.D.   On: 01/26/2018 09:25    ____________________________________________   PROCEDURES  Procedure(s) performed: None  Procedures  Critical Care performed: No  ____________________________________________   INITIAL IMPRESSION / ASSESSMENT AND PLAN / ED COURSE  As part of my medical decision making, I reviewed the following data within the electronic MEDICAL RECORD NUMBER    Cough and congestion secondary to bronchitis.  Discussed chest x-ray findings with patient.  Patient given discharge care instructions and take medication as directed.  Advised to [facility].      ____________________________________________   FINAL CLINICAL IMPRESSION(S) / ED DIAGNOSES  Final diagnoses:  Bronchitis     ED Discharge Orders         Ordered    methylPREDNISolone (MEDROL DOSEPAK) 4 MG TBPK tablet     01/26/18 0937     brompheniramine-pseudoephedrine-DM 30-2-10 MG/5ML syrup  4 times daily PRN     01/26/18 0940           Note:  This document was prepared using Dragon voice recognition software and may include unintentional dictation errors.    Sable Feil, PA-C 01/26/18 0942    Sable Feil, PA-C 01/26/18 5038    Lisa Roca, MD 01/28/18 1030

## 2018-01-26 NOTE — ED Notes (Signed)
See triage note  States she has had intermittent chest discomfort over the past 1-2 days hx of asthma  And has been using her inhalers at home denies any fever  States pain is mainly to right side of chest  Afebrile on arrival

## 2018-02-08 ENCOUNTER — Encounter: Payer: Self-pay | Admitting: Emergency Medicine

## 2018-02-08 ENCOUNTER — Other Ambulatory Visit: Payer: Self-pay

## 2018-02-08 DIAGNOSIS — L209 Atopic dermatitis, unspecified: Secondary | ICD-10-CM | POA: Insufficient documentation

## 2018-02-08 DIAGNOSIS — J45909 Unspecified asthma, uncomplicated: Secondary | ICD-10-CM | POA: Insufficient documentation

## 2018-02-08 DIAGNOSIS — F17210 Nicotine dependence, cigarettes, uncomplicated: Secondary | ICD-10-CM | POA: Insufficient documentation

## 2018-02-08 DIAGNOSIS — Z79899 Other long term (current) drug therapy: Secondary | ICD-10-CM | POA: Insufficient documentation

## 2018-02-08 NOTE — ED Triage Notes (Signed)
Patient ambulatory to triage with steady gait, without difficulty or distress noted; pt reports itchy rash to trunk/arms x wk with no known cause

## 2018-02-09 ENCOUNTER — Emergency Department
Admission: EM | Admit: 2018-02-09 | Discharge: 2018-02-09 | Disposition: A | Discharge status: Home / Self Care | Payer: Self-pay | Attending: Emergency Medicine | Admitting: Emergency Medicine

## 2018-02-09 DIAGNOSIS — L209 Atopic dermatitis, unspecified: Secondary | ICD-10-CM

## 2018-02-09 DIAGNOSIS — R21 Rash and other nonspecific skin eruption: Secondary | ICD-10-CM

## 2018-02-09 MED ORDER — PREDNISONE 20 MG PO TABS: 60.0000 mg | ORAL_TABLET | ORAL | Status: DC

## 2018-02-09 MED ORDER — PREDNISONE 20 MG PO TABS
40.0000 mg | ORAL_TABLET | ORAL | Status: AC
  Administered 2018-02-09: 40 mg via ORAL
  Filled 2018-02-09: qty 2

## 2018-02-09 MED ORDER — PREDNISONE 20 MG PO TABS: ORAL_TABLET | ORAL | 0 refills | Status: DC

## 2018-02-09 NOTE — ED Provider Notes (Signed)
Brenda Aguirre  ____________________________________________   First MD Initiated Contact with Patient 02/09/18 0158     (approximate)  I have reviewed the triage vital signs and the nursing notes.   HISTORY  Chief Complaint Rash    HPI Brenda Aguirre is a 22 y.o. female with a medical history of chronic asthma since childhood.  She presents for evaluation of a rash that has been gradually getting worse over the last week.  It started on her chest and is primarily on her chest although it is also on her back.  She has a couple of small lesions on her arms but those are not as big.  She describes them as dry and flaky, red, and itching.  Nothing particular makes it better or worse.  She has not been diagnosed in the past with eczema.  She denies fever/chills, chest pain, shortness of breath, nausea, vomiting, and abdominal pain.  She was concerned she may have breast cancer because she googled the symptoms and read about peau d'orange and was concerned that at least 1 of the lesions might be that.  She describes the symptoms as moderate to severe.  She has stayed recently with someone that she has not stayed with before and she also recently used a different kind of soap.  No new medications within the last week.  Past Medical History:  Diagnosis Date  . Asthma     There are no active problems to display for this patient.   History reviewed. No pertinent surgical history.  Prior to Admission medications   Medication Sig Start Date End Date Taking? Authorizing Provider  albuterol (PROVENTIL HFA;VENTOLIN HFA) 108 (90 Base) MCG/ACT inhaler Inhale 2 puffs into the lungs every 6 (six) hours as needed for wheezing or shortness of breath. 08/11/16   Johnn Hai, PA-C  albuterol (PROVENTIL) (2.5 MG/3ML) 0.083% nebulizer solution Take 3 mLs (2.5 mg total) by nebulization every 6 (six) hours as needed for wheezing or shortness of  breath. 08/11/16   Johnn Hai, PA-C  brompheniramine-pseudoephedrine-DM 30-2-10 MG/5ML syrup Take 5 mLs by mouth 4 (four) times daily as needed. 01/26/18   Sable Feil, PA-C  methylPREDNISolone (MEDROL DOSEPAK) 4 MG TBPK tablet Take Tapered dose as directed 01/26/18   Sable Feil, PA-C  metroNIDAZOLE (FLAGYL) 500 MG tablet Take 1 tablet (500 mg total) by mouth 2 (two) times daily. 12/13/17   Cuthriell, Charline Bills, PA-C  polyethylene glycol (MIRALAX / GLYCOLAX) packet Take 17 g by mouth daily. 08/28/15   Earleen Newport, MD  predniSONE (DELTASONE) 20 MG tablet Take 40 mg (2 tablets) by mouth daily for four (4) days.  Then take 20 mg (1 tablet) by mouth daily for four (4) more days. 02/09/18   Hinda Kehr, MD    Allergies Patient has no known allergies.  No family history on file.  Social History Social History   Tobacco Use  . Smoking status: Current Some Day Smoker  . Smokeless tobacco: Never Used  Substance Use Topics  . Alcohol use: Yes  . Drug use: No    Review of Systems Constitutional: No fever/chills Eyes: No visual changes. ENT: No sore throat. Cardiovascular: Denies chest pain. Respiratory: Denies shortness of breath. Gastrointestinal: No abdominal pain.  No nausea, no vomiting.   Integumentary: Itchy red and flaky rash as described above. Neurological: Negative for headaches, focal weakness or numbness.   ____________________________________________   PHYSICAL EXAM:  VITAL SIGNS: ED  Triage Vitals  Enc Vitals Group     BP 02/08/18 2314 125/79     Pulse Rate 02/08/18 2314 97     Resp 02/08/18 2314 16     Temp 02/08/18 2314 98.3 F (36.8 C)     Temp Source 02/08/18 2314 Oral     SpO2 02/08/18 2314 100 %     Weight 02/08/18 2315 74.8 kg (165 lb)     Height 02/08/18 2315 1.524 m (5')     Head Circumference --      Peak Flow --      Pain Score 02/08/18 2319 0     Pain Loc --      Pain Edu? --      Excl. in [place]? --     Constitutional: Alert  and oriented. Well appearing and in no acute distress. Eyes: Conjunctivae are normal.  Head: Atraumatic. Nose: No congestion/rhinnorhea. Mouth/Throat: Mucous membranes are moist. Oropharynx non-erythematous.  No oral lesions or mucosal involvement. Neck: No stridor.  No meningeal signs.   Cardiovascular: Normal rate, regular rhythm. Good peripheral circulation. Grossly normal heart sounds. Respiratory: Normal respiratory effort.  No retractions. Lungs CTAB. Gastrointestinal: Soft and nontender. No distention.  Musculoskeletal: No lower extremity tenderness nor edema. No gross deformities of extremities. Neurologic:  Normal speech and language. No gross focal neurologic deficits are appreciated.  Skin:  Skin is warm, dry and intact.  The patient has multiple lesions on her anterior trunk and less so on the back that are most consistent with atopic dermatitis.  She also has some excoriations where she has been scratching and a few small areas that look urticarial but these are very mild compared to the overall atopic dermatitis appearance.  Nothing appears consistent with a more urgent or emergent rash such as erythema migrans, SJS/TEN, erythema nodosum, etc.  Also does not appear consistent with pityriasis rosea given that there are multiple lesions rather than one herald patch.   ____________________________________________   LABS (all labs ordered are listed, but only abnormal results are displayed)  Labs Reviewed - No data to display ____________________________________________  EKG  No indication for EKG ____________________________________________  RADIOLOGY   ED MD interpretation: No indication for imaging  Official radiology report(s): No results found.  ____________________________________________   PROCEDURES  Critical Care performed: No   Procedure(s) performed:   Procedures   ____________________________________________   INITIAL IMPRESSION / ASSESSMENT  AND PLAN / ED COURSE  As part of my medical decision making, I reviewed the following data within the [place] notes reviewed and incorporated, Old chart reviewed and Notes from prior ED visits    Rash is most consistent with atopic dermatitis.  Pityriasis rosacea would be also on the differential but I think less likely based on the overall appearance and the presence of multiple lesions that would have to be considered multiple herald patches.  The patient has asthma and atopic dermatitis/eczema and asthma for the most likely.  She could also be having an allergic reaction to either an allergen in the home at which she recently stayed or her change in soap.  I had my usual customary rash discussion with the patient.  I gave her a first dose of prednisone 40 mg and encouraged the use of over-the-counter Benadryl.  She will take a course of Benadryl as prescribed below (40 mg by mouth daily for 4 days followed by 20 mg by mouth daily for 4 days) I will follow-up with dermatology next week  if she is still having issues.  I gave my usual and customary return precautions.     ____________________________________________  FINAL CLINICAL IMPRESSION(S) / ED DIAGNOSES  Final diagnoses:  Rash and nonspecific skin eruption  Atopic dermatitis, unspecified type     MEDICATIONS GIVEN DURING THIS VISIT:  Medications  predniSONE (DELTASONE) tablet 40 mg (40 mg Oral Given 02/09/18 0227)     ED Discharge Orders         Ordered    predniSONE (DELTASONE) 20 MG tablet     02/09/18 0217           Aguirre:  This document was prepared using Dragon voice recognition software and may include unintentional dictation errors.    Hinda Kehr, MD 02/09/18 [phone]

## 2018-02-09 NOTE — Discharge Instructions (Signed)
Your rash is most consistent with what is called atopic dermatitis.  It is not an infection, but an inflammation of the skin, and it is common in people who also suffer from asthma.  Please read through the included papers for more information and management recommendations, and take the prescription for prednisone for the next eight days.  You should also use over-the-counter Benadryl as needed according to the instructions on the label, but remember that it may make you sleepy so we do not recommend you take it before driving or going to work.  If you are still having problems next week we recommend you follow-up with a dermatologist.  Please use the contact information listed in this paperwork.    Return to the emergency department if you develop new or worsening symptoms that concern you.

## 2018-02-09 NOTE — ED Notes (Signed)
Pt ambulatory to restroom with steady gait. No distress noted at this time.

## 2018-02-14 ENCOUNTER — Emergency Department
Admission: EM | Admit: 2018-02-14 | Discharge: 2018-02-14 | Disposition: A | Discharge status: Home / Self Care | Payer: Self-pay | Attending: Emergency Medicine | Admitting: Emergency Medicine

## 2018-02-14 ENCOUNTER — Emergency Department: Payer: Self-pay

## 2018-02-14 DIAGNOSIS — Z79899 Other long term (current) drug therapy: Secondary | ICD-10-CM | POA: Insufficient documentation

## 2018-02-14 DIAGNOSIS — F17200 Nicotine dependence, unspecified, uncomplicated: Secondary | ICD-10-CM | POA: Insufficient documentation

## 2018-02-14 DIAGNOSIS — J45998 Other asthma: Secondary | ICD-10-CM | POA: Insufficient documentation

## 2018-02-14 DIAGNOSIS — J45901 Unspecified asthma with (acute) exacerbation: Secondary | ICD-10-CM

## 2018-02-14 DIAGNOSIS — L42 Pityriasis rosea: Secondary | ICD-10-CM | POA: Insufficient documentation

## 2018-02-14 LAB — RAPID HIV SCREEN (HIV 1/2 AB+AG)
HIV 1/2 Antibodies: NONREACTIVE
HIV-1 P24 ANTIGEN - HIV24: NONREACTIVE

## 2018-02-14 MED ORDER — ALBUTEROL SULFATE (2.5 MG/3ML) 0.083% IN NEBU
5.0000 mg | INHALATION_SOLUTION | Freq: Once | RESPIRATORY_TRACT | Status: AC
  Administered 2018-02-14: 5 mg via RESPIRATORY_TRACT
  Filled 2018-02-14: qty 6

## 2018-02-14 MED ORDER — TRIAMCINOLONE ACETONIDE 0.1 % EX CREA: 1.0000 "application " | TOPICAL_CREAM | Freq: Two times a day (BID) | CUTANEOUS | 0 refills | Status: DC

## 2018-02-14 MED ORDER — PREDNISONE 20 MG PO TABS
60.0000 mg | ORAL_TABLET | Freq: Once | ORAL | Status: AC
  Administered 2018-02-14: 60 mg via ORAL
  Filled 2018-02-14: qty 3

## 2018-02-14 NOTE — ED Notes (Signed)
Patient transported to X-ray 

## 2018-02-14 NOTE — Discharge Instructions (Signed)
This is most likely pityriasis rosea, we did discuss other testing we are due but we do feel this is most likely pityriasis rosea.  We do asked that you follow-up with a dermatologist in the next few days.  Please use the cream as provided for the next week or 2.  This can last up to 9 weeks, so I do not be surprised if it indoors.  If you have any new or worrisome symptoms including involvement of your mouth or your eyes, or any other mucosal surfaces such as your vagina, or if you have fever, significant shortness of breath cough wheeze or other concerns please return to the emergency department.  Otherwise, follow-up with primary care doctor.  You have been given a prescription for steroids, please take the prescription until is gone.  We also give you a cream to place on that that should help with the rash.

## 2018-02-14 NOTE — ED Provider Notes (Signed)
[facility] Emergency Department Provider Note  ____________________________________________   I have reviewed the triage vital signs and the nursing notes. Where available I have reviewed prior notes and, if possible and indicated, outside hospital notes.    HISTORY  Chief Complaint Rash and Shortness of Breath    HPI Brenda Aguirre is a 22 y.o. female who has a history of chronic asthma, states that she is having a mild asthma flare over the last day or so but that is not why she is here.  This is routine for her she states "I am a lifelong asthma patient".  She does not feel that she is significantly ill by asthma standards, she states that is not unusual for her to have a slight wheeze.  Patient states the reason she is here, is because she has had a diffuse, flat rash which is not involving the hands or the soles or the tongue or any mucosal surfaces, its been there for 2 weeks.  Started a little bit on her back and on her chest.  Mostly is on the torso.  There is been no systemic illness.  She denies any productive cough.  She denies any vaginal discharge or STI symptoms.  She has had no fever or chills.  Occasionally, the rash is itchy.  She was seen here for it a week ago and they ordered prednisone but she has not yet taken it.  She got the prescription filled but has not taken any of the pills.    Past Medical History:  Diagnosis Date  . Asthma     There are no active problems to display for this patient.   History reviewed. No pertinent surgical history.  Prior to Admission medications   Medication Sig Start Date End Date Taking? Authorizing Provider  albuterol (PROVENTIL HFA;VENTOLIN HFA) 108 (90 Base) MCG/ACT inhaler Inhale 2 puffs into the lungs every 6 (six) hours as needed for wheezing or shortness of breath. 08/11/16   Johnn Hai, PA-C  albuterol (PROVENTIL) (2.5 MG/3ML) 0.083% nebulizer solution Take 3 mLs (2.5 mg total) by nebulization  every 6 (six) hours as needed for wheezing or shortness of breath. 08/11/16   Johnn Hai, PA-C  brompheniramine-pseudoephedrine-DM 30-2-10 MG/5ML syrup Take 5 mLs by mouth 4 (four) times daily as needed. 01/26/18   Sable Feil, PA-C  methylPREDNISolone (MEDROL DOSEPAK) 4 MG TBPK tablet Take Tapered dose as directed 01/26/18   Sable Feil, PA-C  metroNIDAZOLE (FLAGYL) 500 MG tablet Take 1 tablet (500 mg total) by mouth 2 (two) times daily. 12/13/17   Cuthriell, Charline Bills, PA-C  polyethylene glycol (MIRALAX / GLYCOLAX) packet Take 17 g by mouth daily. 08/28/15   Earleen Newport, MD  predniSONE (DELTASONE) 20 MG tablet Take 40 mg (2 tablets) by mouth daily for four (4) days.  Then take 20 mg (1 tablet) by mouth daily for four (4) more days. 02/09/18   Hinda Kehr, MD    Allergies Patient has no known allergies.  History reviewed. No pertinent family history.  Social History Social History   Tobacco Use  . Smoking status: Current Some Day Smoker  . Smokeless tobacco: Never Used  Substance Use Topics  . Alcohol use: Yes  . Drug use: No    Review of Systems Constitutional: No fever/chills Eyes: No visual changes. ENT: No sore throat. No stiff neck no neck pain Cardiovascular: Denies chest pain. Respiratory: Positive chronic asthmatic related shortness of breath. Gastrointestinal:   no  vomiting.  No diarrhea.  No constipation. Genitourinary: Negative for dysuria. Musculoskeletal: Negative lower extremity swelling Skin: + for rash. Neurological: Negative for severe headaches, focal weakness or numbness.   ____________________________________________   PHYSICAL EXAM:  VITAL SIGNS: ED Triage Vitals  Enc Vitals Group     BP 02/14/18 1422 124/83     Pulse Rate 02/14/18 1422 (!) 109     Resp 02/14/18 1422 14     Temp 02/14/18 1422 99 F (37.2 C)     Temp Source 02/14/18 1422 Oral     SpO2 02/14/18 1422 91 %     Weight 02/14/18 1425 165 lb (74.8 kg)     Height  02/14/18 1425 5' (1.524 m)     Head Circumference --      Peak Flow --      Pain Score 02/14/18 1424 2     Pain Loc --      Pain Edu? --      Excl. in [place]? --     Constitutional: Alert and oriented. Well appearing and in no acute distress.  Thing and joking with me looking at her cell phone Eyes: Conjunctivae are normal, no evidence of ocular or oral involvement of the rash Head: Atraumatic HEENT: No congestion/rhinnorhea. Mucous membranes are moist.  Oropharynx non-erythematous Neck:   Nontender with no meningismus, no masses, no stridor Cardiovascular: Normal rate, regular rhythm. Grossly normal heart sounds.  Good peripheral circulation. Respiratory: Normal respiratory effort.  No retractions. Lungs CTAB. Abdominal: Soft and nontender. No distention. No guarding no rebound Back:  There is no focal tenderness or step off.  there is no midline tenderness there are no lesions noted. there is no CVA tenderness Musculoskeletal: No lower extremity tenderness, no upper extremity tenderness. No joint effusions, no DVT signs strong distal pulses no edema Neurologic:  Normal speech and language. No gross focal neurologic deficits are appreciated.  Skin:  Skin is warm, dry and intact. No rash noted.  Female nurse chaperone present, there is a diffuse rash mostly on the torso, flat plaques, plaques are blanchable, they are not petechial, there are no petechiae or purpura, there is a larger patch on the back which is approximately 6 cm across most of them there are 2 to 3 cm, they do seem to follow the stress lines of the back.  They also do involve the chest including the breast area and abdomen.  None of them are fluctuant none of them are indurated none of them are tender. Psychiatric: Mood and affect are normal. Speech and behavior are normal.  ____________________________________________   LABS (all labs ordered are listed, but only abnormal results are displayed)  Labs Reviewed  RPR  HIV  ANTIBODY (ROUTINE TESTING W REFLEX)  RAPID HIV SCREEN (HIV 1/2 AB+AG)    Pertinent labs  results that were available during my care of the patient were reviewed by me and considered in my medical decision making (see chart for details). ____________________________________________  EKG  I personally interpreted any EKGs ordered by me or triage  ____________________________________________  RADIOLOGY  Pertinent labs & imaging results that were available during my care of the patient were reviewed by me and considered in my medical decision making (see chart for details). If possible, patient and/or family made aware of any abnormal findings.  Dg Chest 2 View  Result Date: 02/14/2018 CLINICAL DATA:  Known asthma, hx of recent bronchitis diagnosis. SOB and possible allergic reaction EXAM: CHEST - 2 VIEW COMPARISON:  Chest x-ray  dated 01/26/2018. FINDINGS: The heart size and mediastinal contours are within normal limits. Both lungs are clear. The visualized skeletal structures are unremarkable. IMPRESSION: No active cardiopulmonary disease. No evidence of pneumonia or pulmonary edema. Electronically Signed   By: Franki Cabot M.D.   On: 02/14/2018 15:47   ____________________________________________    PROCEDURES  Procedure(s) performed: None  Procedures  Critical Care performed: None  ____________________________________________   INITIAL IMPRESSION / ASSESSMENT AND PLAN / ED COURSE  Pertinent labs & imaging results that were available during my care of the patient were reviewed by me and considered in my medical decision making (see chart for details).  She is here with 2 complaints, they do not seem to me to be likely related.  The first is lifelong asthma, she is having a mild flare, will give her prednisone and albuterol and see if she feels better.  Second is a diffuse rash.  It spares the palms and soles, there is no recent tick bite, there was what appears to be a herald  patch by history, it is consistent on exam with pityriasis rosea.  There is no evidence of petechiae there is no evidence of purpura there is no evidence of systemic illness otherwise, this kind of rash is also sometimes consistent with syphilis although we have low suspicion, does spare the palms and soles, will send however RPR and HIV.  In addition, there is no evidence of ocular oral or vaginal involvement, therefore, I do not think Stevens-Johnson's or TEN is present.  There is a negative Kolsky sign.  There are no bullae.  We will treat her with triamcinolone.  Patient understands that the RPR and HIV test will not come back today and she should follow-up closely with medical records department for results.   ____________________________________________   FINAL CLINICAL IMPRESSION(S) / ED DIAGNOSES  Final diagnoses:  None      This chart was dictated using voice recognition software.  Despite best efforts to proofread,  errors can occur which can change meaning.      Schuyler Amor, MD 02/14/18 [phone]

## 2018-02-14 NOTE — ED Triage Notes (Signed)
Pt presents via POV c/o rash x1 week. Seen in ED and prescribed prednisone x1 week ago however didn't fill until today. +wheezing, hx asthma.

## 2018-02-14 NOTE — ED Notes (Addendum)
Pt reports starting a new fragrance and body wash a couple days ago. Pt also bronchitis 2 weeks ago.

## 2018-02-14 NOTE — ED Triage Notes (Signed)
First Nurse Note:  C/O wheezing and rash x 2 weeks.  States was seen through ED about 1 week ago for same and was prescribed prednisone, which she did not fill until today.  Has been taking benadryl for symptoms, without improvement.    Patient is AAOx3.  Skin warm and dry.  No SOB/ DOE.  Red rash seen to neck and chest.

## 2018-02-14 NOTE — ED Notes (Signed)
Pts boyfriend to the room to provide transportation home. PT ambulatory and in NAD. PT verbalized understanding of discharge papers and prescription waiting at [facility]. Pt verbalized this was her preferred pharmacy.

## 2018-02-16 LAB — HIV ANTIBODY (ROUTINE TESTING W REFLEX): HIV Screen 4th Generation wRfx: NONREACTIVE

## 2018-02-16 LAB — RPR: RPR Ser Ql: NONREACTIVE

## 2018-03-18 ENCOUNTER — Other Ambulatory Visit: Payer: Self-pay

## 2018-03-18 ENCOUNTER — Encounter: Payer: Self-pay | Admitting: Emergency Medicine

## 2018-03-18 DIAGNOSIS — Z79899 Other long term (current) drug therapy: Secondary | ICD-10-CM | POA: Insufficient documentation

## 2018-03-18 DIAGNOSIS — F17200 Nicotine dependence, unspecified, uncomplicated: Secondary | ICD-10-CM | POA: Insufficient documentation

## 2018-03-18 DIAGNOSIS — J020 Streptococcal pharyngitis: Secondary | ICD-10-CM | POA: Insufficient documentation

## 2018-03-18 DIAGNOSIS — J4521 Mild intermittent asthma with (acute) exacerbation: Secondary | ICD-10-CM | POA: Insufficient documentation

## 2018-03-18 MED ORDER — ALBUTEROL SULFATE (2.5 MG/3ML) 0.083% IN NEBU
5.0000 mg | INHALATION_SOLUTION | Freq: Once | RESPIRATORY_TRACT | Status: AC
  Administered 2018-03-18: 5 mg via RESPIRATORY_TRACT
  Filled 2018-03-18: qty 6

## 2018-03-18 NOTE — ED Triage Notes (Addendum)
Patient ambulatory to triage with steady gait, without difficulty or distress noted; pt reports sore throat, cough since yesterday with wheezing; hx asthma and used inhaler PTA

## 2018-03-19 ENCOUNTER — Emergency Department
Admission: EM | Admit: 2018-03-19 | Discharge: 2018-03-19 | Disposition: A | Discharge status: Home / Self Care | Payer: Self-pay | Attending: Emergency Medicine | Admitting: Emergency Medicine

## 2018-03-19 ENCOUNTER — Emergency Department: Payer: Self-pay

## 2018-03-19 DIAGNOSIS — J020 Streptococcal pharyngitis: Secondary | ICD-10-CM

## 2018-03-19 DIAGNOSIS — J4521 Mild intermittent asthma with (acute) exacerbation: Secondary | ICD-10-CM

## 2018-03-19 LAB — GROUP A STREP BY PCR: GROUP A STREP BY PCR: DETECTED — AB

## 2018-03-19 MED ORDER — PENICILLIN G BENZATHINE & PROC 1200000 UNIT/2ML IM SUSP
1.2000 10*6.[IU] | Freq: Once | INTRAMUSCULAR | Status: AC
  Administered 2018-03-19: 1.2 10*6.[IU] via INTRAMUSCULAR
  Filled 2018-03-19: qty 2

## 2018-03-19 MED ORDER — ALBUTEROL SULFATE (2.5 MG/3ML) 0.083% IN NEBU
INHALATION_SOLUTION | RESPIRATORY_TRACT | Status: AC
  Filled 2018-03-19: qty 3

## 2018-03-19 MED ORDER — ALBUTEROL SULFATE (2.5 MG/3ML) 0.083% IN NEBU
5.0000 mg | INHALATION_SOLUTION | Freq: Once | RESPIRATORY_TRACT | Status: AC
  Administered 2018-03-19: 5 mg via RESPIRATORY_TRACT
  Filled 2018-03-19: qty 6

## 2018-03-19 MED ORDER — CEFTRIAXONE SODIUM 1 G IJ SOLR
INTRAMUSCULAR | Status: AC
  Filled 2018-03-19: qty 10

## 2018-03-19 MED ORDER — LIDOCAINE HCL (PF) 1 % IJ SOLN
INTRAMUSCULAR | Status: AC
  Filled 2018-03-19: qty 5

## 2018-03-19 NOTE — ED Provider Notes (Signed)
[facility] Emergency Department Provider Note   First MD Initiated Contact with Patient 03/19/18 [phone]     (approximate)  I have reviewed the triage vital signs and the nursing notes.   HISTORY  Chief Complaint Sore Throat and Asthma    HPI Brenda Aguirre is a 22 y.o. female presents to the emergency department with Sore throat since yesterday.  Patient also admits to symptoms consistent with an asthma exacerbation including cough and wheezing.  Patient states that her asthma is usually exacerbated when the weather changes.  Patient's oral temperature 99.8 on arrival to the emergency department.  Past Medical History:  Diagnosis Date  . Asthma     There are no active problems to display for this patient.   History reviewed. No pertinent surgical history.  Prior to Admission medications   Medication Sig Start Date End Date Taking? Authorizing Provider  albuterol (PROVENTIL HFA;VENTOLIN HFA) 108 (90 Base) MCG/ACT inhaler Inhale 2 puffs into the lungs every 6 (six) hours as needed for wheezing or shortness of breath. 08/11/16   Johnn Hai, PA-C  albuterol (PROVENTIL) (2.5 MG/3ML) 0.083% nebulizer solution Take 3 mLs (2.5 mg total) by nebulization every 6 (six) hours as needed for wheezing or shortness of breath. 08/11/16   Johnn Hai, PA-C  brompheniramine-pseudoephedrine-DM 30-2-10 MG/5ML syrup Take 5 mLs by mouth 4 (four) times daily as needed. 01/26/18   Sable Feil, PA-C  methylPREDNISolone (MEDROL DOSEPAK) 4 MG TBPK tablet Take Tapered dose as directed 01/26/18   Sable Feil, PA-C  metroNIDAZOLE (FLAGYL) 500 MG tablet Take 1 tablet (500 mg total) by mouth 2 (two) times daily. 12/13/17   Cuthriell, Charline Bills, PA-C  polyethylene glycol (MIRALAX / GLYCOLAX) packet Take 17 g by mouth daily. 08/28/15   Earleen Newport, MD  predniSONE (DELTASONE) 20 MG tablet Take 40 mg (2 tablets) by mouth daily for four (4) days.  Then take 20 mg (1  tablet) by mouth daily for four (4) more days. 02/09/18   Hinda Kehr, MD  triamcinolone cream (KENALOG) 0.1 % Apply 1 application topically 2 (two) times daily. 02/14/18   Schuyler Amor, MD    Allergies No known drug allergies No family history on file.  Social History Social History   Tobacco Use  . Smoking status: Current Some Day Smoker  . Smokeless tobacco: Never Used  Substance Use Topics  . Alcohol use: Yes  . Drug use: No    Review of Systems Constitutional: No fever/chills Eyes: No visual changes. ENT: Positive for sore throat. Cardiovascular: Denies chest pain. Respiratory: Denies shortness of breath.  Positive for wheezing and cough Gastrointestinal: No abdominal pain.  No nausea, no vomiting.  No diarrhea.  No constipation. Genitourinary: Negative for dysuria. Musculoskeletal: Negative for neck pain.  Negative for back pain. Integumentary: Negative for rash. Neurological: Negative for headaches, focal weakness or numbness.   ____________________________________________   PHYSICAL EXAM:  VITAL SIGNS: ED Triage Vitals  Enc Vitals Group     BP 03/18/18 2350 120/74     Pulse Rate 03/18/18 2350 (!) 117     Resp 03/18/18 2350 20     Temp 03/18/18 2350 99.8 F (37.7 C)     Temp Source 03/18/18 2350 Oral     SpO2 03/18/18 2350 93 %     Weight 03/18/18 2335 72.6 kg (160 lb)     Height 03/18/18 2335 1.524 m (5')     Head Circumference --  Peak Flow --      Pain Score 03/18/18 2335 5     Pain Loc --      Pain Edu? --      Excl. in [place]? --     Constitutional: Alert and oriented. Well appearing and in no acute distress. Eyes: Conjunctivae are normal.  Mouth/Throat: Mucous membranes are moist.pharyngeal erythema with exudate.  Neck: No stridor.  Positive anterior cervical lymphadenopathy  cardiovascular: Normal rate, regular rhythm. Good peripheral circulation. Grossly normal heart sounds. Respiratory: Normal respiratory effort.  No retractions. Lungs  CTAB. Gastrointestinal: Soft and nontender. No distention.  Musculoskeletal: No lower extremity tenderness nor edema. No gross deformities of extremities. Neurologic:  Normal speech and language. No gross focal neurologic deficits are appreciated.  Skin:  Skin is warm, dry and intact. No rash noted.   ____________________________________________   LABS (all labs ordered are listed, but only abnormal results are displayed)  Labs Reviewed  GROUP A STREP BY PCR - Abnormal; Notable for the following components:      Result Value   Group A Strep by PCR DETECTED (*)    All other components within normal limits    RADIOLOGY I, Chester N Mahalia Dykes, personally viewed and evaluated these images (plain radiographs) as part of my medical decision making, as well as reviewing the written report by the radiologist.  ED MD interpretation: No radiographic evidence of active cardiopulmonary disease  Official radiology report(s): Dg Chest 2 View  Result Date: 03/19/2018 CLINICAL DATA:  Initial evaluation for acute cough, shortness of breath. EXAM: CHEST - 2 VIEW COMPARISON:  Prior radiograph from 02/14/2018. FINDINGS: The cardiac and mediastinal silhouettes are stable in size and contour, and remain within normal limits. The lungs are normally inflated. No airspace consolidation, pleural effusion, or pulmonary edema is identified. There is no pneumothorax. No acute osseous abnormality identified. IMPRESSION: No radiographic evidence for active cardiopulmonary disease. Electronically Signed   By: Jeannine Boga M.D.   On: 03/19/2018 00:22     Procedures   ____________________________________________   INITIAL IMPRESSION / ASSESSMENT AND PLAN / ED COURSE  As part of my medical decision making, I reviewed the following data within the electronic MEDICAL RECORD NUMBER  22 year old female presenting with above-stated history and physical exam secondary to sore throat and asthma exacerbation.   Patient given a DuoNeb with improvement of wheezing.  Patient also given Bicillin 1,200,000 units IM.    ____________________________________________  FINAL CLINICAL IMPRESSION(S) / ED DIAGNOSES  Final diagnoses:  Mild intermittent asthma with exacerbation  Strep pharyngitis     MEDICATIONS GIVEN DURING THIS VISIT:  Medications  albuterol (PROVENTIL) (2.5 MG/3ML) 0.083% nebulizer solution 5 mg (has no administration in time range)  penicillin g procaine-penicillin g benzathine (BICILLIN-CR) injection 600000-600000 units (has no administration in time range)  lidocaine (PF) (XYLOCAINE) 1 % injection (has no administration in time range)  albuterol (PROVENTIL) (2.5 MG/3ML) 0.083% nebulizer solution (has no administration in time range)  cefTRIAXone (ROCEPHIN) 1 g injection (has no administration in time range)  albuterol (PROVENTIL) (2.5 MG/3ML) 0.083% nebulizer solution 5 mg (5 mg Nebulization Given 03/18/18 2357)     ED Discharge Orders    None       Note:  This document was prepared using Dragon voice recognition software and may include unintentional dictation errors.    Gregor Hams, MD 03/19/18 [phone]

## 2018-04-18 ENCOUNTER — Other Ambulatory Visit: Payer: Self-pay

## 2018-04-18 ENCOUNTER — Emergency Department
Admission: EM | Admit: 2018-04-18 | Discharge: 2018-04-18 | Disposition: A | Discharge status: Home / Self Care | Payer: Self-pay | Attending: Emergency Medicine | Admitting: Emergency Medicine

## 2018-04-18 DIAGNOSIS — F17200 Nicotine dependence, unspecified, uncomplicated: Secondary | ICD-10-CM | POA: Insufficient documentation

## 2018-04-18 DIAGNOSIS — X58XXXA Exposure to other specified factors, initial encounter: Secondary | ICD-10-CM | POA: Insufficient documentation

## 2018-04-18 DIAGNOSIS — J45909 Unspecified asthma, uncomplicated: Secondary | ICD-10-CM | POA: Insufficient documentation

## 2018-04-18 DIAGNOSIS — Y998 Other external cause status: Secondary | ICD-10-CM | POA: Insufficient documentation

## 2018-04-18 DIAGNOSIS — Y929 Unspecified place or not applicable: Secondary | ICD-10-CM | POA: Insufficient documentation

## 2018-04-18 DIAGNOSIS — T192XXA Foreign body in vulva and vagina, initial encounter: Secondary | ICD-10-CM | POA: Insufficient documentation

## 2018-04-18 DIAGNOSIS — Y9389 Activity, other specified: Secondary | ICD-10-CM | POA: Insufficient documentation

## 2018-04-18 NOTE — ED Triage Notes (Signed)
Patient reports feels like she has a tampon stuck in her vagina.

## 2018-04-18 NOTE — ED Notes (Signed)
Pt given discharge paper work with no questions.  Pt left ambulatory alone. Brenda Aguirre

## 2018-04-19 NOTE — ED Provider Notes (Signed)
[facility] Emergency Department Provider Note _____________________   First MD Initiated Contact with Patient 04/18/18 2236     (approximate)  I have reviewed the triage vital signs and the nursing notes.   HISTORY  Chief Complaint Foreign Body in Vagina    HPI Brenda Aguirre is a 23 y.o. female presents to the emergency department with concern for tampon that is "stuck in the vagina".  Patient states she last put a tampon in her vagina 2 days ago however she states that she was "drinking alcohol" and is unsure if she removed it.  Patient denies any fever afebrile on presentation.  No nausea no vomiting   Past Medical History:  Diagnosis Date  . Asthma     There are no active problems to display for this patient.   No past surgical history on file.  Prior to Admission medications   Medication Sig Start Date End Date Taking? Authorizing Provider  albuterol (PROVENTIL HFA;VENTOLIN HFA) 108 (90 Base) MCG/ACT inhaler Inhale 2 puffs into the lungs every 6 (six) hours as needed for wheezing or shortness of breath. 08/11/16   Johnn Hai, PA-C  albuterol (PROVENTIL) (2.5 MG/3ML) 0.083% nebulizer solution Take 3 mLs (2.5 mg total) by nebulization every 6 (six) hours as needed for wheezing or shortness of breath. 08/11/16   Johnn Hai, PA-C  brompheniramine-pseudoephedrine-DM 30-2-10 MG/5ML syrup Take 5 mLs by mouth 4 (four) times daily as needed. 01/26/18   Sable Feil, PA-C  methylPREDNISolone (MEDROL DOSEPAK) 4 MG TBPK tablet Take Tapered dose as directed 01/26/18   Sable Feil, PA-C  metroNIDAZOLE (FLAGYL) 500 MG tablet Take 1 tablet (500 mg total) by mouth 2 (two) times daily. 12/13/17   Cuthriell, Charline Bills, PA-C  polyethylene glycol (MIRALAX / GLYCOLAX) packet Take 17 g by mouth daily. 08/28/15   Earleen Newport, MD  predniSONE (DELTASONE) 20 MG tablet Take 40 mg (2 tablets) by mouth daily for four (4) days.  Then take 20 mg (1  tablet) by mouth daily for four (4) more days. 02/09/18   Hinda Kehr, MD  triamcinolone cream (KENALOG) 0.1 % Apply 1 application topically 2 (two) times daily. 02/14/18   Schuyler Amor, MD    Allergies Patient has no known allergies.  No family history on file.  Social History Social History   Tobacco Use  . Smoking status: Current Some Day Smoker  . Smokeless tobacco: Never Used  Substance Use Topics  . Alcohol use: Yes  . Drug use: No    Review of Systems Constitutional: No fever/chills Eyes: No visual changes. ENT: No sore throat. Cardiovascular: Denies chest pain. Respiratory: Denies shortness of breath. Gastrointestinal: No abdominal pain.  No nausea, no vomiting.  No diarrhea.  No constipation. Genitourinary: Negative for dysuria. Musculoskeletal: Negative for neck pain.  Negative for back pain. Integumentary: Negative for rash. Neurological: Negative for headaches, focal weakness or numbness.  ____________________________________________   PHYSICAL EXAM:  VITAL SIGNS: ED Triage Vitals [04/18/18 2123]  Enc Vitals Group     BP (!) 120/94     Pulse Rate (!) 105     Resp 18     Temp 98.2 F (36.8 C)     Temp Source Oral     SpO2 100 %     Weight      Height      Head Circumference      Peak Flow      Pain Score 5  Pain Loc      Pain Edu?      Excl. in [place]?     Constitutional: Alert and oriented. Well appearing and in no acute distress. Mouth/Throat: Mucous membranes are moist.  Oropharynx non-erythematous. Neck: No stridor.  Cardiovascular: Normal rate, regular rhythm. Good peripheral circulation. Grossly normal heart sounds. Respiratory: Normal respiratory effort.  No retractions. Lungs CTAB. Gastrointestinal: Soft and nontender. No distention.  Genitourinary: No gross abnormality noted external vagina.  Speculum exam revealed no evidence of foreign body cervix visualized in its entirety Neurologic:  Normal speech and language. No gross  focal neurologic deficits are appreciated.  Skin:  Skin is warm, dry and intact. No rash noted.     Procedures   ____________________________________________   INITIAL IMPRESSION / ASSESSMENT AND PLAN / ED COURSE  As part of my medical decision making, I reviewed the following data within the electronic MEDICAL RECORD NUMBER   23 year old female presenting with above-stated history and physical exam secondary concern for possible retained tampon in the vagina.  Vaginal exam revealed no evidence of any foreign body within the vagina. ____________________________________________  FINAL CLINICAL IMPRESSION(S) / ED DIAGNOSES  Final diagnoses:  Foreign body in vagina, initial encounter     MEDICATIONS GIVEN DURING THIS VISIT:  Medications - No data to display   ED Discharge Orders    None       Note:  This document was prepared using Dragon voice recognition software and may include unintentional dictation errors.    Gregor Hams, MD 04/19/18 Shelah Lewandowsky

## 2018-09-17 LAB — HM HIV SCREENING LAB: HM HIV Screening: NEGATIVE

## 2018-10-06 ENCOUNTER — Ambulatory Visit: Payer: Self-pay | Admitting: Advanced Practice Midwife

## 2018-10-06 ENCOUNTER — Other Ambulatory Visit: Payer: Self-pay

## 2018-10-06 ENCOUNTER — Ambulatory Visit: Payer: Self-pay

## 2018-10-06 DIAGNOSIS — Z708 Other sex counseling: Secondary | ICD-10-CM

## 2018-10-06 DIAGNOSIS — Z717 Human immunodeficiency virus [HIV] counseling: Secondary | ICD-10-CM

## 2018-10-06 NOTE — Progress Notes (Signed)
Client presents for copy of 09/17/2018 STI testing results. ROI sigend and given copy of HIV, RPR, gonorrhea and chlamydia test results per [facility] policy.

## 2018-10-13 ENCOUNTER — Ambulatory Visit: Payer: Self-pay | Admitting: Obstetrics and Gynecology

## 2018-11-13 ENCOUNTER — Other Ambulatory Visit: Payer: Self-pay

## 2018-11-13 ENCOUNTER — Emergency Department
Admission: EM | Admit: 2018-11-13 | Discharge: 2018-11-14 | Disposition: A | Discharge status: Home / Self Care | Payer: Self-pay | Attending: Emergency Medicine | Admitting: Emergency Medicine

## 2018-11-13 DIAGNOSIS — F17210 Nicotine dependence, cigarettes, uncomplicated: Secondary | ICD-10-CM | POA: Insufficient documentation

## 2018-11-13 DIAGNOSIS — H9202 Otalgia, left ear: Secondary | ICD-10-CM | POA: Insufficient documentation

## 2018-11-13 DIAGNOSIS — Z79899 Other long term (current) drug therapy: Secondary | ICD-10-CM | POA: Insufficient documentation

## 2018-11-13 DIAGNOSIS — J4541 Moderate persistent asthma with (acute) exacerbation: Secondary | ICD-10-CM | POA: Insufficient documentation

## 2018-11-13 MED ORDER — IPRATROPIUM-ALBUTEROL 0.5-2.5 (3) MG/3ML IN SOLN
3.0000 mL | Freq: Once | RESPIRATORY_TRACT | Status: AC
  Administered 2018-11-14: 3 mL via RESPIRATORY_TRACT
  Filled 2018-11-13: qty 3

## 2018-11-13 MED ORDER — METHYLPREDNISOLONE SODIUM SUCC 125 MG IJ SOLR
125.0000 mg | Freq: Once | INTRAMUSCULAR | Status: AC
  Administered 2018-11-14: 125 mg via INTRAVENOUS
  Filled 2018-11-13: qty 2

## 2018-11-13 MED ORDER — MAGNESIUM SULFATE 2 GM/50ML IV SOLN
2.0000 g | Freq: Once | INTRAVENOUS | Status: AC
  Administered 2018-11-14: 2 g via INTRAVENOUS
  Filled 2018-11-13: qty 50

## 2018-11-13 NOTE — ED Notes (Signed)
Dr Karma Greaser at bedside to assess

## 2018-11-13 NOTE — ED Triage Notes (Signed)
Patient c/o SOB, audible wheezes all fields. Patient with labored respirations.

## 2018-11-13 NOTE — ED Notes (Signed)
Pt reports her shortness of breath started around 6pm; worse with exertion; arrives with wheezing throughout; history of asthma and is out of her inhalers; pt says she used to be on daily Spiriva but [facility] doesn't prescribe it for her anymore; talking in short sentences; sats 95% on room air

## 2018-11-14 MED ORDER — ALBUTEROL SULFATE HFA 108 (90 BASE) MCG/ACT IN AERS: 2.0000 | INHALATION_SPRAY | RESPIRATORY_TRACT | 2 refills | Status: DC | PRN

## 2018-11-14 MED ORDER — PREDNISONE 20 MG PO TABS: 60.0000 mg | ORAL_TABLET | Freq: Every day | ORAL | 0 refills | Status: DC

## 2018-11-14 NOTE — ED Notes (Signed)
Visitor at bedside; Dr Karma Greaser in to follow up

## 2018-11-14 NOTE — Discharge Instructions (Signed)
We believe that your symptoms are caused today by an exacerbation of your asthma.  Please take the prescribed medications and any medications that you have at home.  Follow up with your doctor as recommended.  If you develop any new or worsening symptoms, including but not limited to fever, persistent vomiting, worsening shortness of breath, or other symptoms that concern you, please return to the Emergency Department immediately.  

## 2018-11-14 NOTE — ED Provider Notes (Signed)
[facility] Emergency Department Provider Note  ____________________________________________   First MD Initiated Contact with Patient 11/13/18 2345     (approximate)  I have reviewed the triage vital signs and the nursing notes.   HISTORY  Chief Complaint Shortness of Breath    HPI Brenda Aguirre is a 23 y.o. female whose only chronic medical history is asthma but he also occasionally uses tobacco.  She presents for gradually worsening and increasing frequency of episodes of shortness of breath over the last few days consistent with her asthma.  She says she is using her nebulizer treatments at home.  She used to take Spiriva but her primary care provider stopped prescribing it for her.  She has not been on steroids for quite a while.  She says that her symptoms seem to be somewhat exercised and heat induced.  She is currently wheezing and using accessory muscles.  She says her symptoms have been worse in the past but she also says that they currently feels severe.  Nebulizer treatments make her feel better but the symptoms again get worse.  She denies any contact with COVID-19 patients and says that she stays at home.  She denies sore throat, chest pain, nausea, vomiting, and abdominal pain.  She does have some cough associated with her asthma but that is roughly at baseline.  She has not had any fever, chills, nor body aches.   Of note, her only other symptom is reporting that she has pain in her left ear that is primarily been over the last 24 hours that she describes as severe and says that "it really hurts".      Past Medical History:  Diagnosis Date  . Asthma     There are no active problems to display for this patient.   No past surgical history on file.  Prior to Admission medications   Medication Sig Start Date End Date Taking? Authorizing Provider  albuterol (PROVENTIL) (2.5 MG/3ML) 0.083% nebulizer solution Take 3 mLs (2.5 mg total) by  nebulization every 6 (six) hours as needed for wheezing or shortness of breath. 08/11/16   Johnn Hai, PA-C  albuterol (VENTOLIN HFA) 108 (90 Base) MCG/ACT inhaler Inhale 2-4 puffs into the lungs every 4 (four) hours as needed for wheezing or shortness of breath. 11/14/18   Hinda Kehr, MD  brompheniramine-pseudoephedrine-DM 30-2-10 MG/5ML syrup Take 5 mLs by mouth 4 (four) times daily as needed. 01/26/18   Sable Feil, PA-C  methylPREDNISolone (MEDROL DOSEPAK) 4 MG TBPK tablet Take Tapered dose as directed 01/26/18   Sable Feil, PA-C  metroNIDAZOLE (FLAGYL) 500 MG tablet Take 1 tablet (500 mg total) by mouth 2 (two) times daily. 12/13/17   Cuthriell, Charline Bills, PA-C  polyethylene glycol (MIRALAX / GLYCOLAX) packet Take 17 g by mouth daily. 08/28/15   Earleen Newport, MD  predniSONE (DELTASONE) 20 MG tablet Take 3 tablets (60 mg total) by mouth daily. 11/14/18   Hinda Kehr, MD  triamcinolone cream (KENALOG) 0.1 % Apply 1 application topically 2 (two) times daily. 02/14/18   Schuyler Amor, MD    Allergies Patient has no known allergies.  No family history on file.  Social History Social History   Tobacco Use  . Smoking status: Current Some Day Smoker  . Smokeless tobacco: Never Used  Substance Use Topics  . Alcohol use: Yes  . Drug use: No    Review of Systems Constitutional: No fever/chills Eyes: No visual changes. ENT: No  sore throat. Cardiovascular: Denies chest pain. Respiratory: Shortness of breath and wheezing with associated nonproductive cough. Gastrointestinal: No abdominal pain.  No nausea, no vomiting.  No diarrhea.  No constipation. Genitourinary: Negative for dysuria. Musculoskeletal: Negative for neck pain.  Negative for back pain. Integumentary: Negative for rash. Neurological: Negative for headaches, focal weakness or numbness.   ____________________________________________   PHYSICAL EXAM:  VITAL SIGNS: ED Triage Vitals  Enc Vitals  Group     BP 11/13/18 2337 109/72     Pulse Rate 11/13/18 2337 (!) 118     Resp 11/13/18 2337 (!) 28     Temp 11/13/18 2337 98.6 F (37 C)     Temp src --      SpO2 11/13/18 2337 91 %     Weight 11/13/18 2336 68 kg (150 lb)     Height 11/13/18 2336 1.524 m (5')     Head Circumference --      Peak Flow --      Pain Score 11/13/18 2336 10     Pain Loc --      Pain Edu? --      Excl. in [place]? --     Constitutional: Alert and oriented.  Mild respiratory distress. Eyes: Conjunctivae are normal.  Head: Atraumatic. Ears:  Healthy appearing ear canals and TMs bilaterally without evidence of infection nor effusion. Nose: No congestion/rhinnorhea. Mouth/Throat: Mucous membranes are moist. Neck: No stridor.  No meningeal signs.   Cardiovascular: Normal rate, regular rhythm. Good peripheral circulation. Grossly normal heart sounds. Respiratory: Increased respiratory effort with mild retractions and accessory muscle usage.  Expiratory wheezing heard throughout.  Speaking in full sentences but requires a deep wheezing breath in between sentences. Gastrointestinal: Soft and nontender. No distention.  Musculoskeletal: No lower extremity tenderness nor edema. No gross deformities of extremities. Neurologic:  Normal speech and language. No gross focal neurologic deficits are appreciated.  Skin:  Skin is warm, dry and intact. Psychiatric: Mood and affect are normal. Speech and behavior are normal.  ____________________________________________   LABS (all labs ordered are listed, but only abnormal results are displayed)  Labs Reviewed - No data to display ____________________________________________  EKG  ED ECG REPORT I, Hinda Kehr, the attending physician, personally viewed and interpreted this ECG.  Date: 11/13/2018 EKG Time: 23: 43 Rate: 109 Rhythm: Sinus tachycardia QRS Axis: normal Intervals: normal ST/T Wave abnormalities: normal Narrative Interpretation: no evidence of acute  ischemia  ____________________________________________  RADIOLOGY I, Hinda Kehr, personally viewed and evaluated these images (plain radiographs) as part of my medical decision making, as well as reviewing the written report by the radiologist.  ED MD interpretation: No indication for imaging  Official radiology report(s): No results found.  ____________________________________________   PROCEDURES   Procedure(s) performed (including Critical Care):  Procedures   ____________________________________________   INITIAL IMPRESSION / MDM / Secretary / ED COURSE  As part of my medical decision making, I reviewed the following data within the [facility] notes reviewed and incorporated, Old chart reviewed and Notes from prior ED visits   Differential diagnosis includes, but is not limited to, asthma exacerbation, community-acquired pneumonia, COVID-19, PE.  The patient is generally well-appearing and in only mild respiratory distress though she does have very audible wheezing throughout her lung fields.  She is familiar with her asthma as she has been dealing with it since childhood and she is adamant that she does not need to stay in the hospital.  I offered rapid  COVID testing in case she does need to stay for every 2 hours nebulizer treatments but she says that she will feel better after she is breathing treatments and steroids.  She has no known COVID-19 contacts I think this is acceptable that we will take steps to protect our staff as much as possible with the nebulized treatments.  I am giving her three duo nebs, Solu-Medrol 125 mg IV, and magnesium sulfate 2 g IV.  I will observe the patient and make sure she is feeling better after the treatments.  We will reassess whether she will be appropriate for discharge and outpatient follow-up at that time.  She understands agrees with plan.  Although she also complains of left-sided otalgia she has  well-appearing and healthy ears and tympanic membranes and is likely related to a viral process.  No indication for lab work at this time.      Clinical Course as of Nov 14 122  Sun Nov 14, 2018  0121 Patient says she feels all better and is no longer wheezing and wants to go home.  I gave my usual customary return precautions.   [CF]    Clinical Course User Index [CF] Hinda Kehr, MD     ____________________________________________  FINAL CLINICAL IMPRESSION(S) / ED DIAGNOSES  Final diagnoses:  Moderate persistent asthma with exacerbation  Acute otalgia, left     MEDICATIONS GIVEN DURING THIS VISIT:  Medications  ipratropium-albuterol (DUONEB) 0.5-2.5 (3) MG/3ML nebulizer solution 3 mL (3 mLs Nebulization Given 11/14/18 0010)  ipratropium-albuterol (DUONEB) 0.5-2.5 (3) MG/3ML nebulizer solution 3 mL (3 mLs Nebulization Given 11/14/18 0010)  ipratropium-albuterol (DUONEB) 0.5-2.5 (3) MG/3ML nebulizer solution 3 mL (3 mLs Nebulization Given 11/14/18 0009)  methylPREDNISolone sodium succinate (SOLU-MEDROL) 125 mg/2 mL injection 125 mg (125 mg Intravenous Given 11/14/18 0009)  magnesium sulfate IVPB 2 g 50 mL (2 g Intravenous New Bag/Given 11/14/18 0013)     ED Discharge Orders         Ordered    albuterol (VENTOLIN HFA) 108 (90 Base) MCG/ACT inhaler  Every 4 hours PRN     11/14/18 0123    predniSONE (DELTASONE) 20 MG tablet  Daily     11/14/18 0123          *Please note:  Brenda Aguirre was evaluated in Emergency Department on 11/14/2018 for the symptoms described in the history of present illness. She was evaluated in the context of the global COVID-19 pandemic, which necessitated consideration that the patient might be at risk for infection with the SARS-CoV-2 virus that causes COVID-19. Institutional protocols and algorithms that pertain to the evaluation of patients at risk for COVID-19 are in a state of rapid change based on information released by regulatory bodies  including the CDC and federal and state organizations. These policies and algorithms were followed during the patient's care in the ED.  Some ED evaluations and interventions may be delayed as a result of limited staffing during the pandemic.*  Note:  This document was prepared using Dragon voice recognition software and may include unintentional dictation errors.   Hinda Kehr, MD 11/14/18 [phone]

## 2018-11-14 NOTE — ED Notes (Signed)
Pt awake and alert; says she's feeling some better; sats 93% on room; air; given warm blanket and apple juice to drink as allowed by MD;

## 2018-12-03 ENCOUNTER — Emergency Department
Admission: EM | Admit: 2018-12-03 | Discharge: 2018-12-03 | Disposition: A | Discharge status: Home / Self Care | Payer: Self-pay | Attending: Emergency Medicine | Admitting: Emergency Medicine

## 2018-12-03 ENCOUNTER — Other Ambulatory Visit: Payer: Self-pay

## 2018-12-03 ENCOUNTER — Encounter: Payer: Self-pay | Admitting: Emergency Medicine

## 2018-12-03 DIAGNOSIS — J45909 Unspecified asthma, uncomplicated: Secondary | ICD-10-CM | POA: Insufficient documentation

## 2018-12-03 DIAGNOSIS — Z79899 Other long term (current) drug therapy: Secondary | ICD-10-CM | POA: Insufficient documentation

## 2018-12-03 DIAGNOSIS — J0120 Acute ethmoidal sinusitis, unspecified: Secondary | ICD-10-CM | POA: Insufficient documentation

## 2018-12-03 DIAGNOSIS — F17210 Nicotine dependence, cigarettes, uncomplicated: Secondary | ICD-10-CM | POA: Insufficient documentation

## 2018-12-03 MED ORDER — AMOXICILLIN-POT CLAVULANATE 875-125 MG PO TABS
1.0000 | ORAL_TABLET | Freq: Once | ORAL | Status: AC
  Administered 2018-12-03: 1 via ORAL
  Filled 2018-12-03: qty 1

## 2018-12-03 MED ORDER — CETIRIZINE HCL 10 MG PO TABS: 10.0000 mg | ORAL_TABLET | Freq: Every day | ORAL | 0 refills | Status: DC

## 2018-12-03 MED ORDER — FLUTICASONE PROPIONATE 50 MCG/ACT NA SUSP: 1.0000 | Freq: Two times a day (BID) | NASAL | 0 refills | Status: DC

## 2018-12-03 MED ORDER — AMOXICILLIN-POT CLAVULANATE 875-125 MG PO TABS: 1.0000 | ORAL_TABLET | Freq: Two times a day (BID) | ORAL | 0 refills | Status: DC

## 2018-12-03 NOTE — ED Provider Notes (Signed)
[facility] Emergency Department Provider Note  ____________________________________________  Time seen: Approximately 9:42 PM  I have reviewed the triage vital signs and the nursing notes.   HISTORY  Chief Complaint Nasal Congestion    HPI Brenda Aguirre is a 23 y.o. female who presents the emergency department complaining of nasal congestion, sinus pressure and headache.  Patient reports that she believes she has a sinus infection.  She took over-the-counter sinus medication which improved symptoms somewhat but the pressure has worsened.  Patient denies any fevers or chills, sore throat, coughing, shortness of breath.  Patient has not been around anybody that is been sick recently.  She denies any concerns for COVID-19 at this time.         Past Medical History:  Diagnosis Date  . Asthma     There are no active problems to display for this patient.   History reviewed. No pertinent surgical history.  Prior to Admission medications   Medication Sig Start Date End Date Taking? Authorizing Provider  albuterol (PROVENTIL) (2.5 MG/3ML) 0.083% nebulizer solution Take 3 mLs (2.5 mg total) by nebulization every 6 (six) hours as needed for wheezing or shortness of breath. 08/11/16   Johnn Hai, PA-C  albuterol (VENTOLIN HFA) 108 (90 Base) MCG/ACT inhaler Inhale 2-4 puffs into the lungs every 4 (four) hours as needed for wheezing or shortness of breath. 11/14/18   Hinda Kehr, MD  amoxicillin-clavulanate (AUGMENTIN) 875-125 MG tablet Take 1 tablet by mouth 2 (two) times daily. 12/03/18   Cuthriell, Charline Bills, PA-C  brompheniramine-pseudoephedrine-DM 30-2-10 MG/5ML syrup Take 5 mLs by mouth 4 (four) times daily as needed. 01/26/18   Sable Feil, PA-C  cetirizine (ZYRTEC) 10 MG tablet Take 1 tablet (10 mg total) by mouth daily. 12/03/18   Cuthriell, Charline Bills, PA-C  fluticasone (FLONASE) 50 MCG/ACT nasal spray Place 1 spray into both nostrils 2 (two)  times daily. 12/03/18   Cuthriell, Charline Bills, PA-C  methylPREDNISolone (MEDROL DOSEPAK) 4 MG TBPK tablet Take Tapered dose as directed 01/26/18   Sable Feil, PA-C  metroNIDAZOLE (FLAGYL) 500 MG tablet Take 1 tablet (500 mg total) by mouth 2 (two) times daily. 12/13/17   Cuthriell, Charline Bills, PA-C  polyethylene glycol (MIRALAX / GLYCOLAX) packet Take 17 g by mouth daily. 08/28/15   Earleen Newport, MD  predniSONE (DELTASONE) 20 MG tablet Take 3 tablets (60 mg total) by mouth daily. 11/14/18   Hinda Kehr, MD  triamcinolone cream (KENALOG) 0.1 % Apply 1 application topically 2 (two) times daily. 02/14/18   Schuyler Amor, MD    Allergies Patient has no known allergies.  No family history on file.  Social History Social History   Tobacco Use  . Smoking status: Current Some Day Smoker  . Smokeless tobacco: Never Used  Substance Use Topics  . Alcohol use: Yes  . Drug use: No     Review of Systems  Constitutional: No fever/chills Eyes: No visual changes. No discharge ENT: Sinus congestion, sinus pressure, headache Cardiovascular: no chest pain. Respiratory: no cough. No SOB. Gastrointestinal: No abdominal pain.  No nausea, no vomiting.  No diarrhea.  No constipation. Musculoskeletal: Negative for musculoskeletal pain. Skin: Negative for rash, abrasions, lacerations, ecchymosis. Neurological: Negative for headaches, focal weakness or numbness. 10-point ROS otherwise negative.  ____________________________________________   PHYSICAL EXAM:  VITAL SIGNS: ED Triage Vitals  Enc Vitals Group     BP 12/03/18 1945 120/81     Pulse Rate 12/03/18 1945 (!)  111     Resp 12/03/18 1945 18     Temp 12/03/18 1945 99.2 F (37.3 C)     Temp Source 12/03/18 1945 Oral     SpO2 12/03/18 1945 97 %     Weight 12/03/18 1949 135 lb (61.2 kg)     Height 12/03/18 1949 5\' 2"  (1.575 m)     Head Circumference --      Peak Flow --      Pain Score 12/03/18 1949 10     Pain Loc --       Pain Edu? --      Excl. in [place]? --      Constitutional: Alert and oriented. Well appearing and in no acute distress. Eyes: Conjunctivae are normal. PERRL. EOMI. Head: Atraumatic. ENT:      Ears: EACs and TMs unremarkable bilaterally      Nose: Positive congestion/rhinnorhea.  Patient is mildly tender to percussion along the ethmoid sinuses.  No other tenderness to percussion.      Mouth/Throat: Mucous membranes are moist.  Oropharynx is nonerythematous and nonedematous.  Uvula is midline.   Neck: No stridor.  Neck is supple full range of motion Hematological/Lymphatic/Immunilogical: Scattered, mobile, mildly tender anterior cervical lymphadenopathy. Cardiovascular: Normal rate, regular rhythm. Normal S1 and S2.  Good peripheral circulation. Respiratory: Normal respiratory effort without tachypnea or retractions. Lungs CTAB. Good air entry to the bases with no decreased or absent breath sounds. Musculoskeletal: Full range of motion to all extremities. No gross deformities appreciated. Neurologic:  Normal speech and language. No gross focal neurologic deficits are appreciated.  Skin:  Skin is warm, dry and intact. No rash noted. Psychiatric: Mood and affect are normal. Speech and behavior are normal. Patient exhibits appropriate insight and judgement.   ____________________________________________   LABS (all labs ordered are listed, but only abnormal results are displayed)  Labs Reviewed - No data to display ____________________________________________  EKG   ____________________________________________  RADIOLOGY   No results found.  ____________________________________________    PROCEDURES  Procedure(s) performed:    Procedures    Medications  amoxicillin-clavulanate (AUGMENTIN) 875-125 MG per tablet 1 tablet (has no administration in time range)     ____________________________________________   INITIAL IMPRESSION / ASSESSMENT AND PLAN / ED  COURSE  Pertinent labs & imaging results that were available during my care of the patient were reviewed by me and considered in my medical decision making (see chart for details).  Review of the [place] CSRS was performed in accordance of the [place] prior to dispensing any controlled drugs.           Patient's diagnosis is consistent with bacterial sinusitis.  Patient presented to the emergency department complaining of nasal congestion, sinus pressure and associated sinus headache.  Differential included viral URI, sinusitis, COVID-19.  Given physical exam findings, symptoms, I suspect bacterial sinusitis.  Patient will be started on antibiotics, Flonase and Zyrtec.  Follow-up primary care as needed..Patient is given ED precautions to return to the ED for any worsening or new symptoms.     ____________________________________________  FINAL CLINICAL IMPRESSION(S) / ED DIAGNOSES  Final diagnoses:  Acute non-recurrent ethmoidal sinusitis      NEW MEDICATIONS STARTED DURING THIS VISIT:  ED Discharge Orders         Ordered    amoxicillin-clavulanate (AUGMENTIN) 875-125 MG tablet  2 times daily     12/03/18 2145    fluticasone (FLONASE) 50 MCG/ACT nasal spray  2 times daily     12/03/18  2145    cetirizine (ZYRTEC) 10 MG tablet  Daily     12/03/18 2145              This chart was dictated using voice recognition software/Dragon. Despite best efforts to proofread, errors can occur which can change the meaning. Any change was purely unintentional.    Darletta Moll, PA-C 12/03/18 2146    Earleen Newport, MD 12/03/18 2258

## 2018-12-03 NOTE — ED Triage Notes (Signed)
Pt arrives POV to triage with c/o nasal congestion x 3-4 day with resulting HA. Pt states no other complaints at this time and is in NAD.

## 2018-12-03 NOTE — ED Notes (Signed)
Pt verbalized understanding of discharge instructions. NAD at this time. 

## 2018-12-03 NOTE — ED Notes (Signed)
Signature pad not working. Hard copy printed and signed by patient.  

## 2018-12-08 DIAGNOSIS — E663 Overweight: Secondary | ICD-10-CM

## 2018-12-09 ENCOUNTER — Ambulatory Visit: Payer: Self-pay

## 2018-12-21 ENCOUNTER — Ambulatory Visit: Payer: Self-pay

## 2019-01-18 ENCOUNTER — Encounter: Payer: Self-pay | Admitting: Intensive Care

## 2019-01-18 ENCOUNTER — Emergency Department
Admission: EM | Admit: 2019-01-18 | Discharge: 2019-01-18 | Disposition: A | Discharge status: Home / Self Care | Payer: Self-pay | Attending: Student | Admitting: Student

## 2019-01-18 ENCOUNTER — Emergency Department: Payer: Self-pay

## 2019-01-18 ENCOUNTER — Other Ambulatory Visit: Payer: Self-pay

## 2019-01-18 DIAGNOSIS — Z79899 Other long term (current) drug therapy: Secondary | ICD-10-CM | POA: Diagnosis not present

## 2019-01-18 DIAGNOSIS — Y92410 Unspecified street and highway as the place of occurrence of the external cause: Secondary | ICD-10-CM | POA: Insufficient documentation

## 2019-01-18 DIAGNOSIS — J45909 Unspecified asthma, uncomplicated: Secondary | ICD-10-CM | POA: Insufficient documentation

## 2019-01-18 DIAGNOSIS — G44309 Post-traumatic headache, unspecified, not intractable: Secondary | ICD-10-CM | POA: Diagnosis not present

## 2019-01-18 DIAGNOSIS — M25551 Pain in right hip: Secondary | ICD-10-CM | POA: Diagnosis not present

## 2019-01-18 DIAGNOSIS — Y939 Activity, unspecified: Secondary | ICD-10-CM | POA: Insufficient documentation

## 2019-01-18 DIAGNOSIS — Y999 Unspecified external cause status: Secondary | ICD-10-CM | POA: Insufficient documentation

## 2019-01-18 DIAGNOSIS — Z87891 Personal history of nicotine dependence: Secondary | ICD-10-CM | POA: Insufficient documentation

## 2019-01-18 DIAGNOSIS — M545 Low back pain, unspecified: Secondary | ICD-10-CM

## 2019-01-18 DIAGNOSIS — S161XXA Strain of muscle, fascia and tendon at neck level, initial encounter: Secondary | ICD-10-CM | POA: Insufficient documentation

## 2019-01-18 DIAGNOSIS — S199XXA Unspecified injury of neck, initial encounter: Secondary | ICD-10-CM | POA: Diagnosis present

## 2019-01-18 LAB — POCT PREGNANCY, URINE: Preg Test, Ur: NEGATIVE

## 2019-01-18 MED ORDER — NAPROXEN 500 MG PO TABS: 500.0000 mg | ORAL_TABLET | Freq: Two times a day (BID) | ORAL | 0 refills | Status: DC

## 2019-01-18 MED ORDER — TRAMADOL HCL 50 MG PO TABS: 50.0000 mg | ORAL_TABLET | Freq: Four times a day (QID) | ORAL | 0 refills | Status: DC | PRN

## 2019-01-18 MED ORDER — METHOCARBAMOL 500 MG PO TABS: 500.0000 mg | ORAL_TABLET | Freq: Four times a day (QID) | ORAL | 0 refills | Status: DC | PRN
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
## 2019-01-18 NOTE — Discharge Instructions (Signed)
Follow-up with your primary care provider or [facility] acute care if any continued problems.  Take medication only as directed.  Do not take the muscle relaxant and pain medication if you plan on driving or operating machinery as it could cause drowsiness and increase your risk for injury.  Use ice or heat to your muscles as needed for discomfort.  You will most likely be sore for approximately 4 to 5 days even with the medication.  Return to the emergency department if any severe worsening of your symptoms.

## 2019-01-28 ENCOUNTER — Ambulatory Visit: Payer: Self-pay | Admitting: Physician Assistant

## 2019-01-28 ENCOUNTER — Other Ambulatory Visit: Payer: Self-pay

## 2019-01-28 DIAGNOSIS — Z113 Encounter for screening for infections with a predominantly sexual mode of transmission: Secondary | ICD-10-CM

## 2019-01-28 LAB — WET PREP FOR TRICH, YEAST, CLUE
Trichomonas Exam: NEGATIVE
Yeast Exam: NEGATIVE

## 2019-01-29 ENCOUNTER — Encounter: Payer: Self-pay | Admitting: Physician Assistant

## 2019-01-29 NOTE — Progress Notes (Signed)
    STI clinic/screening visit  Subjective:  Brenda Aguirre is a 23 y.o. female being seen today for an STI screening visit. The patient reports they do not have symptoms.  Patient has the following medical conditions:   Patient Active Problem List   Diagnosis Date Noted  . Overweight 01/06/2018     Chief Complaint  Patient presents with  . SEXUALLY TRANSMITTED DISEASE    HPI  Patient reports that she has a new partner and would like a screening.  Denies any symptoms today.  LMP 01/18/2019 and normal.  Using condoms for [facility].    See flowsheet for further details and programmatic requirements.    The following portions of the patient's history were reviewed and updated as appropriate: allergies, current medications, past medical history, past social history, past surgical history and problem list.  Objective:  There were no vitals filed for this visit.  Physical Exam Constitutional:      General: She is not in acute distress.    Appearance: Normal appearance.  HENT:     Head: Normocephalic and atraumatic.     Mouth/Throat:     Mouth: Mucous membranes are moist.     Pharynx: Oropharynx is clear. No oropharyngeal exudate or posterior oropharyngeal erythema.  Eyes:     Conjunctiva/sclera: Conjunctivae normal.  Neck:     Musculoskeletal: Neck supple.  Pulmonary:     Effort: Pulmonary effort is normal.  Abdominal:     Palpations: Abdomen is soft. There is no mass.     Tenderness: There is no abdominal tenderness. There is no guarding or rebound.  Genitourinary:    General: Normal vulva.     Rectum: Normal.     Comments: External genitalia/pubic area without nits, lice, edema, erythema, lesions and inguinal adenopathy. Vagina with normal mucosa and small amount of brownish menstrual blood. Cervix without visible lesions.  Uterus firm, mobile, nt, no masses, no CMT, no adnexal tenderness or fullness. Lymphadenopathy:     Cervical: No cervical adenopathy.  Skin:  General: Skin is warm and dry.     Findings: No bruising, erythema, lesion or rash.  Neurological:     Mental Status: She is alert and oriented to person, place, and time.  Psychiatric:        Mood and Affect: Mood normal.        Behavior: Behavior normal.        Thought Content: Thought content normal.        Judgment: Judgment normal.       Assessment and Plan:  Brenda Aguirre is a 23 y.o. female presenting to the [facility] Department for STI screening  1. Screening for STD (sexually transmitted disease) Patient into clinic for screening and is without symptoms. Rec condoms with all sex. Await test results.  Counseled that RN will call if needs to RTC for any treatment once results are back.  - WET PREP FOR TRICH, YEAST, CLUE - Gonococcus culture - Chlamydia/Gonorrhea [place] [facility] - HIV [place] [facility] - Syphilis Serology, [place] [facility] - Gonococcus culture     No follow-ups on file.  No future appointments.  Jerene Dilling, PA

## 2019-02-02 LAB — GONOCOCCUS CULTURE

## 2019-02-07 ENCOUNTER — Other Ambulatory Visit: Payer: Self-pay

## 2019-02-07 ENCOUNTER — Ambulatory Visit: Payer: Self-pay | Admitting: Physician Assistant

## 2019-02-07 DIAGNOSIS — A539 Syphilis, unspecified: Secondary | ICD-10-CM

## 2019-02-07 DIAGNOSIS — Z712 Person consulting for explanation of examination or test findings: Secondary | ICD-10-CM

## 2019-02-07 MED ORDER — PENICILLIN G BENZATHINE 1200000 UNIT/2ML IM SUSP
2.4000 10*6.[IU] | Freq: Once | INTRAMUSCULAR | Status: AC
  Administered 2019-02-07: 2.4 10*6.[IU] via INTRAMUSCULAR

## 2019-02-07 NOTE — Progress Notes (Signed)
Pt states she is only here for syphilis tx. Pt treated for syphilis with Bicillin LA 2.4 MU (x1 set) per verbal order by Antoine Primas, PA. Provider orders completed.

## 2019-02-08 ENCOUNTER — Encounter: Payer: Self-pay | Admitting: Physician Assistant

## 2019-02-08 NOTE — Progress Notes (Signed)
S:  Patient into clinic for treatment today. States that she does not have questions and denies that she has had any symptoms of Syphilis. O:  WDWN female in NAD, A&O x3 RPR= reactive 1:32, Syphilis TP -reactive. A/P:  1.  Syphilis needs treatment. 2.  Counseled patient on Syphilis - dz, transmission, course if not treated and also sequelae. 3.  Counseled re:  Recommendation to RTC for titer check in 6 months, 12 months and annually. 4.  Bicillin 2.4 mu IM to be given today 5.  No sex for 14 days and until after partner completes treatment. 6.  Rec condoms with all sex. 7.  RTC 6 months and prn.

## 2019-03-16 ENCOUNTER — Other Ambulatory Visit: Payer: Self-pay

## 2019-03-16 DIAGNOSIS — Z20822 Contact with and (suspected) exposure to covid-19: Secondary | ICD-10-CM

## 2019-03-17 LAB — NOVEL CORONAVIRUS, NAA: SARS-CoV-2, NAA: NOT DETECTED

## 2019-04-29 ENCOUNTER — Ambulatory Visit: Payer: Self-pay

## 2019-05-16 ENCOUNTER — Ambulatory Visit: Payer: Self-pay

## 2019-06-02 ENCOUNTER — Other Ambulatory Visit: Payer: Self-pay

## 2019-06-02 ENCOUNTER — Ambulatory Visit: Payer: Self-pay | Admitting: Physician Assistant

## 2019-06-02 DIAGNOSIS — Z113 Encounter for screening for infections with a predominantly sexual mode of transmission: Secondary | ICD-10-CM

## 2019-06-02 DIAGNOSIS — Z3009 Encounter for other general counseling and advice on contraception: Secondary | ICD-10-CM

## 2019-06-02 LAB — WET PREP FOR TRICH, YEAST, CLUE
Trichomonas Exam: NEGATIVE
Yeast Exam: NEGATIVE

## 2019-06-02 MED ORDER — MULTIVITAMINS PO CAPS: 1.0000 | ORAL_CAPSULE | Freq: Every day | ORAL | 0 refills | Status: DC

## 2019-06-02 NOTE — Progress Notes (Signed)
Wet mount reviewed, no tx per provider orders. Provider orders completed. 

## 2019-06-03 ENCOUNTER — Encounter: Payer: Self-pay | Admitting: Physician Assistant

## 2019-06-03 NOTE — Progress Notes (Signed)
[facility] Department STI clinic/screening visit  Subjective:  Brenda Aguirre is a 24 y.o. female being seen today for an STI screening visit. The patient reports they do not have symptoms.  Patient reports that they might desire a pregnancy in the next year.   They reported they are not interested in discussing contraception today.  No LMP recorded.   Patient has the following medical conditions:   Patient Active Problem List   Diagnosis Date Noted  . Syphilis 02/07/2019  . Overweight 01/06/2018    Chief Complaint  Patient presents with  . SEXUALLY TRANSMITTED DISEASE    STD screening including bloodwork    HPI  Patient reports that she does not have any symptoms but would like a screening today.  LMP 05/24/2019 and normal.  Using condoms with all sex for STD and pregnancy protection.  See flowsheet for further details and programmatic requirements.    The following portions of the patient's history were reviewed and updated as appropriate: allergies, current medications, past medical history, past social history, past surgical history and problem list.  Objective:  There were no vitals filed for this visit.  Physical Exam Constitutional:      General: She is not in acute distress.    Appearance: Normal appearance. She is normal weight.  HENT:     Head: Normocephalic and atraumatic.     Comments: No nits, lice, or hair loss. No cervical, supraclavicular or axillary adenopathy.    Mouth/Throat:     Mouth: Mucous membranes are moist.     Pharynx: Oropharynx is clear. No oropharyngeal exudate or posterior oropharyngeal erythema.  Eyes:     Conjunctiva/sclera: Conjunctivae normal.  Pulmonary:     Effort: Pulmonary effort is normal.  Abdominal:     Palpations: Abdomen is soft. There is no mass.     Tenderness: There is no abdominal tenderness. There is no guarding or rebound.  Genitourinary:    General: Normal vulva.     Rectum: Normal.     Comments:  External genitalia/pubic area without nits, lice, edema, erythema, lesions and inguinal adenopathy. Vagina with normal mucosa and discharge. Cervix without visible lesions. Uterus firm, mobile, nt, no masses, no CMT, no adnexal tenderness or fullness. Musculoskeletal:     Cervical back: Neck supple. No tenderness.  Skin:    General: Skin is warm and dry.     Findings: No bruising, erythema, lesion or rash.  Neurological:     Mental Status: She is alert and oriented to person, place, and time.  Psychiatric:        Mood and Affect: Mood normal.        Behavior: Behavior normal.        Thought Content: Thought content normal.        Judgment: Judgment normal.      Assessment and Plan:  RAJANAE CAUSEVIC is a 24 y.o. female presenting to the [facility] Department for STI screening  1. Screening for STD (sexually transmitted disease) Patient into clinic without symptoms. Rec condoms with all sex. Await test results.  Counseled that RN will call if needs to RTC for treatment once results are back.  - WET PREP FOR TRICH, YEAST, CLUE - Gonococcus culture - Chlamydia/Gonorrhea [place] [facility] - HIV [place] [facility] - Syphilis Serology, [place] [facility] - Gonococcus culture  2. Family planning services Patient given MVI and healthy pregnancy pamphlet by RN.  - Multiple Vitamin (MULTIVITAMIN) capsule; Take 1 capsule by mouth  daily.  Dispense: 100 capsule; Refill: 0     No follow-ups on file.  No future appointments.  Jerene Dilling, PA

## 2019-06-07 LAB — GONOCOCCUS CULTURE

## 2019-06-30 ENCOUNTER — Emergency Department
Admission: EM | Admit: 2019-06-30 | Discharge: 2019-06-30 | Disposition: A | Discharge status: Home / Self Care | Payer: Self-pay | Attending: Emergency Medicine | Admitting: Emergency Medicine

## 2019-06-30 ENCOUNTER — Other Ambulatory Visit: Payer: Self-pay

## 2019-06-30 ENCOUNTER — Telehealth: Payer: Self-pay | Admitting: Family Medicine

## 2019-06-30 ENCOUNTER — Encounter: Payer: Self-pay | Admitting: Emergency Medicine

## 2019-06-30 DIAGNOSIS — Z5321 Procedure and treatment not carried out due to patient leaving prior to being seen by health care provider: Secondary | ICD-10-CM | POA: Insufficient documentation

## 2019-06-30 DIAGNOSIS — R1031 Right lower quadrant pain: Secondary | ICD-10-CM | POA: Insufficient documentation

## 2019-06-30 LAB — URINALYSIS, COMPLETE (UACMP) WITH MICROSCOPIC
Bilirubin Urine: NEGATIVE
Glucose, UA: NEGATIVE mg/dL
Ketones, ur: NEGATIVE mg/dL
Nitrite: POSITIVE — AB
Protein, ur: 30 mg/dL — AB
Specific Gravity, Urine: 1.02 (ref 1.005–1.030)
pH: 7.5 (ref 5.0–8.0)

## 2019-06-30 LAB — CBC
HCT: 37.1 % (ref 36.0–46.0)
Hemoglobin: 13.4 g/dL (ref 12.0–15.0)
MCH: 30.4 pg (ref 26.0–34.0)
MCHC: 36.1 g/dL — ABNORMAL HIGH (ref 30.0–36.0)
MCV: 84.1 fL (ref 80.0–100.0)
Platelets: 299 10*3/uL (ref 150–400)
RBC: 4.41 MIL/uL (ref 3.87–5.11)
RDW: 13.5 % (ref 11.5–15.5)
WBC: 5.6 10*3/uL (ref 4.0–10.5)
nRBC: 0 % (ref 0.0–0.2)

## 2019-06-30 LAB — COMPREHENSIVE METABOLIC PANEL
ALT: 16 U/L (ref 0–44)
AST: 19 U/L (ref 15–41)
Albumin: 4.1 g/dL (ref 3.5–5.0)
Alkaline Phosphatase: 52 U/L (ref 38–126)
Anion gap: 7 (ref 5–15)
BUN: 14 mg/dL (ref 6–20)
CO2: 25 mmol/L (ref 22–32)
Calcium: 9.5 mg/dL (ref 8.9–10.3)
Chloride: 107 mmol/L (ref 98–111)
Creatinine, Ser: 0.71 mg/dL (ref 0.44–1.00)
GFR calc Af Amer: >60 mL/min (ref >60)
GFR calc non Af Amer: >60 mL/min (ref >60)
Glucose, Bld: 90 mg/dL (ref 70–99)
Potassium: 4.1 mmol/L (ref 3.5–5.1)
Sodium: 139 mmol/L (ref 135–145)
Total Bilirubin: 1.1 mg/dL (ref 0.3–1.2)
Total Protein: 7.2 g/dL (ref 6.5–8.1)

## 2019-06-30 LAB — POCT PREGNANCY, URINE: Preg Test, Ur: NEGATIVE

## 2019-06-30 LAB — LIPASE, BLOOD: Lipase: 40 U/L (ref 11–51)

## 2019-06-30 NOTE — ED Triage Notes (Signed)
Pt presents to ER from home report having RLQ abdominal pain, reports she has experienced this symptom before when she forgot a tampon in her vaginal area. Pt denies any other symptom at present, pt talks in complete sentences no distress noted

## 2019-07-04 ENCOUNTER — Ambulatory Visit: Payer: Self-pay | Admitting: Physician Assistant

## 2019-07-04 ENCOUNTER — Other Ambulatory Visit: Payer: Self-pay

## 2019-07-04 DIAGNOSIS — B9689 Other specified bacterial agents as the cause of diseases classified elsewhere: Secondary | ICD-10-CM

## 2019-07-04 DIAGNOSIS — Z113 Encounter for screening for infections with a predominantly sexual mode of transmission: Secondary | ICD-10-CM

## 2019-07-04 DIAGNOSIS — N760 Acute vaginitis: Secondary | ICD-10-CM

## 2019-07-04 DIAGNOSIS — T192XXA Foreign body in vulva and vagina, initial encounter: Secondary | ICD-10-CM

## 2019-07-04 MED ORDER — METRONIDAZOLE 500 MG PO TABS: 500.0000 mg | ORAL_TABLET | Freq: Two times a day (BID) | ORAL | 0 refills | Status: AC

## 2019-07-05 NOTE — Progress Notes (Signed)
S:  Patient into clinic requesting removal of retained tampon today.  Declines any testing today and only wants removal done since she was screened about 1 month ago and is not having any symptoms or new partners.  LMP 06/22/2019 and normal.  Is not sure how long the tampon has been in but thinks at least 7 or 8 days.  Denies rash, nausea, vomiting, fever, chills and abdominal pain. O:  WDWN female in NAD, A&O x3, pleasant and cooperative.  Skin=warm and dry, without lesions and rashes; abdomen= soft, nt, no masses, no rebound; external genitalia=normal female without nits, lice, edema, erythema, lesions and inguinal adenopathy; vagina=slightly dry mucosa, visualized tampon deep in vagina and attempted to remove, first attempt yielded a retained condom, second and third attempts did not move tampon, so bimanual done with lubricant to shift tampon to a position where it was grasped and removed as a whole.  Vagina inspected and swept with repeat bimanual to ensure that no fragments were retained. A/P:  1.  Retained condom and tampon removed from vagina today. 2.  Reviewed with patient s/s of toxic shock syndrome to look out for and to see PCP or to ER if notices any of these symptoms. 3.  Will give Metronidazole 500mg  #14 1 po BID for 7 days with food, no EtOH for 24 hr before and until 72 hr after completing medicine. 4.  Rec using OTC antifungal cream if has itching during or just after treatment with antibiotic. 5.  Rec that patient not use tampons for period for at least 4 months. 6.  Call with any questions or concerns and RTC prn.

## 2019-07-28 NOTE — Telephone Encounter (Signed)
error 

## 2019-08-16 ENCOUNTER — Ambulatory Visit: Payer: Self-pay | Admitting: Physician Assistant

## 2019-08-16 ENCOUNTER — Other Ambulatory Visit: Payer: Self-pay

## 2019-08-16 DIAGNOSIS — N760 Acute vaginitis: Secondary | ICD-10-CM

## 2019-08-16 DIAGNOSIS — Z113 Encounter for screening for infections with a predominantly sexual mode of transmission: Secondary | ICD-10-CM

## 2019-08-16 DIAGNOSIS — B9689 Other specified bacterial agents as the cause of diseases classified elsewhere: Secondary | ICD-10-CM

## 2019-08-16 DIAGNOSIS — T192XXA Foreign body in vulva and vagina, initial encounter: Secondary | ICD-10-CM

## 2019-08-16 LAB — WET PREP FOR TRICH, YEAST, CLUE
Trichomonas Exam: NEGATIVE
Yeast Exam: NEGATIVE

## 2019-08-16 MED ORDER — METRONIDAZOLE 500 MG PO TABS: 500.0000 mg | ORAL_TABLET | Freq: Two times a day (BID) | ORAL | 0 refills | Status: AC

## 2019-08-22 ENCOUNTER — Encounter: Payer: Self-pay | Admitting: Physician Assistant

## 2019-08-22 NOTE — Progress Notes (Signed)
[facility] Department STI clinic/screening visit  Subjective:  Brenda Aguirre is a 24 y.o. female being seen today for an STI screening visit. The patient reports they do have symptoms.  Patient reports that they do not desire a pregnancy in the next year.   They reported they are not interested in discussing contraception today.  No LMP recorded.   Patient has the following medical conditions:   Patient Active Problem List   Diagnosis Date Noted  . Syphilis 02/07/2019  . Overweight 01/06/2018    Chief Complaint  Patient presents with  . SEXUALLY TRANSMITTED DISEASE    screening    HPI  Patient reports that she thinks that she may have a retained condom.  Reports that she has had "old blood" again like she had when she last had a retained tampon in place.  LMP 07/27/2019 and normal.   See flowsheet for further details and programmatic requirements.    The following portions of the patient's history were reviewed and updated as appropriate: allergies, current medications, past medical history, past social history, past surgical history and problem list.  Objective:  There were no vitals filed for this visit.  Physical Exam Constitutional:      General: She is not in acute distress.    Appearance: Normal appearance.  HENT:     Head: Normocephalic and atraumatic.     Comments: No nits, lice or hair loss.  No cervical, supraclavicular or axillary adenopathy.     Mouth/Throat:     Mouth: Mucous membranes are moist.     Pharynx: Oropharynx is clear. No oropharyngeal exudate or posterior oropharyngeal erythema.  Eyes:     Conjunctiva/sclera: Conjunctivae normal.  Pulmonary:     Effort: Pulmonary effort is normal.  Abdominal:     Palpations: Abdomen is soft. There is no mass.     Tenderness: There is no abdominal tenderness. There is no guarding or rebound.  Genitourinary:    General: Normal vulva.     Rectum: Normal.     Comments: External  genitalia/pubic area without nits, lice, edema, erythema, lesions and inguinal adenopathy. Vagina with normal mucosa, small amount of dark brownish discharge and retained tampon present and removed without difficulty. Cervix without visible lesions. Uterus firm, mobile,nt, no masses, no CMT, no adnexal tenderness or fullness. Musculoskeletal:     Cervical back: Neck supple. No tenderness.  Skin:    General: Skin is warm and dry.     Findings: No bruising, erythema, lesion or rash.  Neurological:     Mental Status: She is alert and oriented to person, place, and time.  Psychiatric:        Mood and Affect: Mood normal.        Behavior: Behavior normal.        Thought Content: Thought content normal.        Judgment: Judgment normal.      Assessment and Plan:  Brenda Aguirre is a 24 y.o. female presenting to the [facility] Department for STI screening  1. Screening for STD (sexually transmitted disease) Patient into clinic with symptoms.  Declines blood work today. Rec condoms with all sex. Await test results.  Counseled that RN will call if needs to RTC for further treatment once results are back.  - WET PREP FOR [place], YEAST, [place] [facility]  2. Foreign body in vagina, initial encounter Tampon removed from vagina. Reviewed with patient risks of toxic shock syndrome and symptoms  to look out for. Rec not using tampons for about 3-4 months.  3. BV (bacterial vaginosis) Treat BV with Metronidazole 500mg  #14 1 po BID for 7 days with food, no EtOH for 24 hr before and until 72 hr after completing medicine. No sex for 7 days. Rec use OTC antifungal cream if has itching during or just after treatment with antibiotic. - metroNIDAZOLE (FLAGYL) 500 MG tablet; Take 1 tablet (500 mg total) by mouth 2 (two) times daily for 7 days.  Dispense: 14 tablet; Refill: 0     No follow-ups on file.  No future appointments.  Jerene Dilling, PA

## 2019-08-28 NOTE — Progress Notes (Signed)
Chart reviewed by Pharmacist  Suzanne Walker PharmD, Contract Pharmacist at [facility]  

## 2019-09-29 ENCOUNTER — Other Ambulatory Visit: Payer: Self-pay

## 2019-09-29 ENCOUNTER — Ambulatory Visit (LOCAL_COMMUNITY_HEALTH_CENTER): Payer: Self-pay | Admitting: Family Medicine

## 2019-09-29 ENCOUNTER — Encounter: Payer: Self-pay | Admitting: Family Medicine

## 2019-09-29 VITALS — BP 122/77 | Ht 60.5 in | Wt 149.2 lb

## 2019-09-29 DIAGNOSIS — Z3201 Encounter for pregnancy test, result positive: Secondary | ICD-10-CM

## 2019-09-29 LAB — PREGNANCY, URINE: Preg Test, Ur: POSITIVE — AB

## 2019-09-29 MED ORDER — PRENATAL VITAMINS 28-0.8 MG PO TABS: 1.0000 | ORAL_TABLET | ORAL | 0 refills | Status: AC

## 2019-09-29 NOTE — Progress Notes (Signed)
Patient in clinic today for PT.  Patient informed of positive pregnancy.  Patient unsure of where she wants to go for  prenatal care. Patient declined referral to [facility] and [facility] for preadmission for services today.  Patient to contact clinic of choice  to initiate care. Patient questions answered and pregnancy packet given. Junious Dresser, RN

## 2019-10-24 ENCOUNTER — Telehealth: Payer: Self-pay

## 2019-10-24 NOTE — Telephone Encounter (Signed)
Call to client to f/u +PT done 09/29/19-reports has miscarriage and does have f/u appt. @ [facility], declines any further assistance Debera Lat, RN

## 2019-12-11 ENCOUNTER — Emergency Department
Admission: EM | Admit: 2019-12-11 | Discharge: 2019-12-11 | Disposition: A | Discharge status: Home / Self Care | Payer: No Typology Code available for payment source | Attending: Emergency Medicine | Admitting: Emergency Medicine

## 2019-12-11 ENCOUNTER — Emergency Department: Payer: No Typology Code available for payment source

## 2019-12-11 ENCOUNTER — Encounter: Payer: Self-pay | Admitting: Emergency Medicine

## 2019-12-11 ENCOUNTER — Other Ambulatory Visit: Payer: Self-pay

## 2019-12-11 DIAGNOSIS — F17210 Nicotine dependence, cigarettes, uncomplicated: Secondary | ICD-10-CM | POA: Insufficient documentation

## 2019-12-11 DIAGNOSIS — Y92410 Unspecified street and highway as the place of occurrence of the external cause: Secondary | ICD-10-CM | POA: Diagnosis not present

## 2019-12-11 DIAGNOSIS — Z79899 Other long term (current) drug therapy: Secondary | ICD-10-CM | POA: Diagnosis not present

## 2019-12-11 DIAGNOSIS — Y939 Activity, unspecified: Secondary | ICD-10-CM | POA: Insufficient documentation

## 2019-12-11 DIAGNOSIS — J45909 Unspecified asthma, uncomplicated: Secondary | ICD-10-CM | POA: Diagnosis not present

## 2019-12-11 DIAGNOSIS — S8254XA Nondisplaced fracture of medial malleolus of right tibia, initial encounter for closed fracture: Secondary | ICD-10-CM | POA: Insufficient documentation

## 2019-12-11 DIAGNOSIS — Y999 Unspecified external cause status: Secondary | ICD-10-CM | POA: Diagnosis not present

## 2019-12-11 DIAGNOSIS — S8991XA Unspecified injury of right lower leg, initial encounter: Secondary | ICD-10-CM | POA: Diagnosis present

## 2019-12-11 MED ORDER — HYDROCODONE-ACETAMINOPHEN 5-325 MG PO TABS: 1.0000 | ORAL_TABLET | Freq: Four times a day (QID) | ORAL | 0 refills | Status: DC | PRN

## 2019-12-11 MED ORDER — HYDROCODONE-ACETAMINOPHEN 5-325 MG PO TABS
1.0000 | ORAL_TABLET | Freq: Once | ORAL | Status: AC
  Administered 2019-12-11: 1 via ORAL
  Filled 2019-12-11: qty 1
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
## 2019-12-11 NOTE — Discharge Instructions (Signed)
Call Tuesday make an appointment for an office visit with Dr. Posey Pronto who is on-call for orthopedics.  His contact information is listed on your discharge papers.  Ice and elevation over the weekend.  Do not bear weight on your ankle.  Use crutches anytime you are up.  Leave the splint on until seen by the orthopedist.  A prescription for hydrocodone was sent to your pharmacy.  This is every 6 hours as needed for pain.  You may also take ibuprofen with this medication if additional pain medication is needed.
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
## 2019-12-26 ENCOUNTER — Ambulatory Visit: Payer: Self-pay

## 2020-01-31 ENCOUNTER — Ambulatory Visit: Payer: Self-pay

## 2020-02-21 ENCOUNTER — Encounter: Payer: Self-pay | Admitting: Advanced Practice Midwife

## 2020-02-21 ENCOUNTER — Other Ambulatory Visit: Payer: Self-pay

## 2020-02-21 ENCOUNTER — Ambulatory Visit: Payer: Self-pay | Admitting: Advanced Practice Midwife

## 2020-02-21 DIAGNOSIS — F17290 Nicotine dependence, other tobacco product, uncomplicated: Secondary | ICD-10-CM

## 2020-02-21 DIAGNOSIS — F1290 Cannabis use, unspecified, uncomplicated: Secondary | ICD-10-CM

## 2020-02-21 DIAGNOSIS — Z113 Encounter for screening for infections with a predominantly sexual mode of transmission: Secondary | ICD-10-CM

## 2020-02-21 LAB — WET PREP FOR TRICH, YEAST, CLUE
Trichomonas Exam: NEGATIVE
Yeast Exam: NEGATIVE

## 2020-02-21 NOTE — Progress Notes (Signed)
Wet mount reviewed - negative results. Rich Number, RN

## 2020-02-21 NOTE — Progress Notes (Signed)
[facility] Department STI clinic/screening visit  Subjective:  Brenda Aguirre is a 24 y.o. SBF G4P0 smoker female being seen today for an STI screening visit. The patient reports they do have symptoms.  Patient reports that they do not desire a pregnancy in the next year.   They reported they are not interested in discussing contraception today.  Patient's last menstrual period was 02/06/2020 (exact date).   Patient has the following medical conditions:   Patient Active Problem List   Diagnosis Date Noted  . Syphilis 02/07/2019  . Overweight 01/06/2018    Chief Complaint  Patient presents with  . SEXUALLY TRANSMITTED DISEASE    HPI  Patient reports noticed a "boil on my butt" 3 days ago.  Last sex today with condom; with current partner x1 year; 2 sex partners in last 3 mo.  Smoking  2 Black & Milds/day and vapes.  Last MJ 02/17/20.  Last ETOH 02/18/20 (4 shots) q weekend.  LMP 02/06/20. EAB 2013  Last HIV test per patient/review of record was 06/02/19 Patient reports last pap was 11/25/2017 neg  See flowsheet for further details and programmatic requirements.    The following portions of the patient's history were reviewed and updated as appropriate: allergies, current medications, past medical history, past social history, past surgical history and problem list.  Objective:  There were no vitals filed for this visit.  Physical Exam Vitals and nursing note reviewed.  Constitutional:      Appearance: Normal appearance.  HENT:     Head: Normocephalic and atraumatic.     Mouth/Throat:     Mouth: Mucous membranes are moist.     Pharynx: Oropharynx is clear. No oropharyngeal exudate or posterior oropharyngeal erythema.  Eyes:     Conjunctiva/sclera: Conjunctivae normal.  Pulmonary:     Effort: Pulmonary effort is normal.  Abdominal:     Palpations: Abdomen is soft. There is no mass.     Tenderness: There is no abdominal tenderness. There is no rebound.      Comments: Soft without masses or tenderness  Genitourinary:    General: Normal vulva.     Exam position: Lithotomy position.     Pubic Area: No rash or pubic lice.      Labia:        Right: No rash or lesion.        Left: No rash or lesion.      Vagina: Normal. No vaginal discharge (small amt white creamy leukorrhea, ph>4.5), erythema, bleeding or lesions.     Cervix: Normal.     Uterus: Normal.      Adnexa: Right adnexa normal and left adnexa normal.     Rectum: Normal.  Lymphadenopathy:     Head:     Right side of head: No preauricular or posterior auricular adenopathy.     Left side of head: No preauricular or posterior auricular adenopathy.     Cervical: No cervical adenopathy.     Upper Body:     Right upper body: No supraclavicular or axillary adenopathy.     Left upper body: No supraclavicular or axillary adenopathy.     Lower Body: No right inguinal adenopathy. No left inguinal adenopathy.  Skin:    General: Skin is warm and dry.     Findings: No rash.  Neurological:     Mental Status: She is alert and oriented to person, place, and time.      Assessment and Plan:  ETSUKO DIEROLF is  a 24 y.o. female presenting to the [facility] Department for STI screening  1. Screening examination for venereal disease Treat wet mount per standing orders Immunization nurse consult Counseled to see primary care MD if boil worsens  - WET PREP FOR [place], YEAST, CLUE - Chlamydia/Gonorrhea [place] [facility] - Gonococcus culture - Gonococcus culture     No follow-ups on file.  No future appointments.  Herbie Saxon, CNM

## 2020-02-26 LAB — GONOCOCCUS CULTURE

## 2020-03-29 ENCOUNTER — Other Ambulatory Visit: Payer: Self-pay | Admitting: Orthopedic Surgery

## 2020-03-29 DIAGNOSIS — S8254XK Nondisplaced fracture of medial malleolus of right tibia, subsequent encounter for closed fracture with nonunion: Secondary | ICD-10-CM

## 2020-04-17 ENCOUNTER — Ambulatory Visit: Admission: RE | Admit: 2020-04-17 | Payer: Self-pay | Source: Ambulatory Visit

## 2020-07-21 ENCOUNTER — Other Ambulatory Visit: Payer: Self-pay

## 2020-07-21 ENCOUNTER — Emergency Department
Admission: EM | Admit: 2020-07-21 | Discharge: 2020-07-21 | Disposition: A | Discharge status: Home / Self Care | Payer: Self-pay | Attending: Emergency Medicine | Admitting: Emergency Medicine

## 2020-07-21 ENCOUNTER — Emergency Department: Payer: Self-pay

## 2020-07-21 ENCOUNTER — Encounter: Payer: Self-pay | Admitting: Intensive Care

## 2020-07-21 DIAGNOSIS — N939 Abnormal uterine and vaginal bleeding, unspecified: Secondary | ICD-10-CM

## 2020-07-21 DIAGNOSIS — O4691 Antepartum hemorrhage, unspecified, first trimester: Secondary | ICD-10-CM | POA: Insufficient documentation

## 2020-07-21 DIAGNOSIS — Z79899 Other long term (current) drug therapy: Secondary | ICD-10-CM | POA: Insufficient documentation

## 2020-07-21 DIAGNOSIS — F17290 Nicotine dependence, other tobacco product, uncomplicated: Secondary | ICD-10-CM | POA: Insufficient documentation

## 2020-07-21 DIAGNOSIS — Z3A01 Less than 8 weeks gestation of pregnancy: Secondary | ICD-10-CM | POA: Insufficient documentation

## 2020-07-21 DIAGNOSIS — O209 Hemorrhage in early pregnancy, unspecified: Secondary | ICD-10-CM

## 2020-07-21 DIAGNOSIS — J45909 Unspecified asthma, uncomplicated: Secondary | ICD-10-CM | POA: Insufficient documentation

## 2020-07-21 LAB — CBC WITH DIFFERENTIAL/PLATELET
Abs Immature Granulocytes: 0.01 10*3/uL (ref 0.00–0.07)
Basophils Absolute: 0 10*3/uL (ref 0.0–0.1)
Basophils Relative: 1 %
Eosinophils Absolute: 0.2 10*3/uL (ref 0.0–0.5)
Eosinophils Relative: 3 %
HCT: 38.1 % (ref 36.0–46.0)
Hemoglobin: 14.1 g/dL (ref 12.0–15.0)
Immature Granulocytes: 0 %
Lymphocytes Relative: 50 %
Lymphs Abs: 2.9 10*3/uL (ref 0.7–4.0)
MCH: 30.9 pg (ref 26.0–34.0)
MCHC: 37 g/dL — ABNORMAL HIGH (ref 30.0–36.0)
MCV: 83.4 fL (ref 80.0–100.0)
Monocytes Absolute: 0.7 10*3/uL (ref 0.1–1.0)
Monocytes Relative: 13 %
Neutro Abs: 1.9 10*3/uL (ref 1.7–7.7)
Neutrophils Relative %: 33 %
Platelets: 247 10*3/uL (ref 150–400)
RBC: 4.57 MIL/uL (ref 3.87–5.11)
RDW: 12.8 % (ref 11.5–15.5)
WBC: 5.7 10*3/uL (ref 4.0–10.5)
nRBC: 0 % (ref 0.0–0.2)

## 2020-07-21 LAB — URINALYSIS, COMPLETE (UACMP) WITH MICROSCOPIC
Bacteria, UA: NONE SEEN
Bilirubin Urine: NEGATIVE
Glucose, UA: NEGATIVE mg/dL
Hgb urine dipstick: NEGATIVE
Ketones, ur: 5 mg/dL — AB
Leukocytes,Ua: NEGATIVE
Nitrite: NEGATIVE
Protein, ur: NEGATIVE mg/dL
Specific Gravity, Urine: 1.029 (ref 1.005–1.030)
pH: 5 (ref 5.0–8.0)

## 2020-07-21 LAB — COMPREHENSIVE METABOLIC PANEL
ALT: 15 U/L (ref 0–44)
AST: 16 U/L (ref 15–41)
Albumin: 4 g/dL (ref 3.5–5.0)
Alkaline Phosphatase: 40 U/L (ref 38–126)
Anion gap: 8 (ref 5–15)
BUN: 9 mg/dL (ref 6–20)
CO2: 23 mmol/L (ref 22–32)
Calcium: 8.9 mg/dL (ref 8.9–10.3)
Chloride: 105 mmol/L (ref 98–111)
Creatinine, Ser: 0.68 mg/dL (ref 0.44–1.00)
GFR, Estimated: >60 mL/min (ref >60)
Glucose, Bld: 97 mg/dL (ref 70–99)
Potassium: 3.7 mmol/L (ref 3.5–5.1)
Sodium: 136 mmol/L (ref 135–145)
Total Bilirubin: 1.1 mg/dL (ref 0.3–1.2)
Total Protein: 7.3 g/dL (ref 6.5–8.1)

## 2020-07-21 LAB — ABO/RH: ABO/RH(D): A NEG

## 2020-07-21 LAB — HCG, QUANTITATIVE, PREGNANCY: hCG, Beta Chain, Quant, S: 1650 m[IU]/mL — ABNORMAL HIGH (ref <5)

## 2020-07-21 NOTE — ED Triage Notes (Signed)
Patient c/o vaginal bleeding that started X4 days ago. Reports getting positive pregnancy test before this vaginal bleeding started.

## 2020-07-21 NOTE — ED Provider Notes (Signed)
[facility] Emergency Department Provider Note ____________________________________________  Time seen: 0850  I have reviewed the triage vital signs and the nursing notes.  HISTORY  Chief Complaint  Vaginal Bleeding   HPI Brenda Aguirre is a 25 y.o. female presents to the ER today with complaint of breast tenderness, nausea, pelvic cramping and vaginal bleeding.  She reports this started 4 days ago.  She reports her menses was late so she took a pregnancy test at home which was positive.  Her LMP was 06/05/2020.  She is G3 P0020.  She reports the vaginal bleeding started out as light pink spotting which eventually turned into dark brown discharge and is now bright red in color.  She reports the pelvic cramping comes and goes.  She has not had any vomiting.  She denies urinary frequency, urgency, dysuria or blood in her urine.  She is currently sexually active but denies vaginal discharge, itching, odor or prior abnormal bleeding.  She is not on contraception at this time.  She has not taken anything OTC for her symptoms.  Past Medical History:  Diagnosis Date  . Asthma     Patient Active Problem List   Diagnosis Date Noted  . Cigar smoker 2/day + vapes 02/21/2020  . Marijuana use 02/21/2020  . Syphilis 02/07/2019  . Overweight 01/06/2018    History reviewed. No pertinent surgical history.  Prior to Admission medications   Medication Sig Start Date End Date Taking? Authorizing Provider  albuterol (PROVENTIL) (2.5 MG/3ML) 0.083% nebulizer solution Take 3 mLs (2.5 mg total) by nebulization every 6 (six) hours as needed for wheezing or shortness of breath. 08/11/16   Johnn Hai, PA-C  albuterol (VENTOLIN HFA) 108 (90 Base) MCG/ACT inhaler Inhale 2-4 puffs into the lungs every 4 (four) hours as needed for wheezing or shortness of breath. 11/14/18   Hinda Kehr, MD    Allergies Patient has no known allergies.  Family History  Problem Relation Age of Onset   . Hypertension Maternal Grandmother     Social History Social History   Tobacco Use  . Smoking status: Current Every Day Smoker    Types: Cigars, E-cigarettes, Cigarettes  . Smokeless tobacco: Never Used  . Tobacco comment: Smoking x4 years  Vaping Use  . Vaping Use: Some days  . Start date: 09/29/2018  Substance Use Topics  . Alcohol use: Yes    Alcohol/week: 7.0 standard drinks    Types: 3 Glasses of wine, 4 Shots of liquor per week  . Drug use: Yes    Frequency: 3.0 times per week    Types: Marijuana    Review of Systems  Constitutional: Negative for fever, chills or body aches. Cardiovascular: Negative for chest pain or chest tightness. Respiratory: Negative for cough or shortness of breath. Gastrointestinal: Positive for nausea, intermittent pelvic cramping.  Negative for  vomiting. Genitourinary: Negative for urinary urgency, frequency, dysuria, blood in her urine, vaginal discharge, itching, odor or abnormal bleeding. Skin: Positive for breast tenderness.  Negative for rash or lesion in the vaginal area.  ____________________________________________  PHYSICAL EXAM:  VITAL SIGNS: ED Triage Vitals  Enc Vitals Group     BP 07/21/20 0825 116/82     Pulse Rate 07/21/20 0825 96     Resp 07/21/20 0825 16     Temp 07/21/20 0825 98.5 F (36.9 C)     Temp Source 07/21/20 0825 Oral     SpO2 07/21/20 0825 99 %     Weight 07/21/20 0821  152 lb (68.9 kg)     Height 07/21/20 0821 5\' 1"  (1.549 m)     Head Circumference --      Peak Flow --      Pain Score 07/21/20 0821 0     Pain Loc --      Pain Edu? --      Excl. in [place]? --     Constitutional: Alert and oriented. Well appearing and in no distress. Head: Normocephalic. Eyes: Normal extraocular movements Cardiovascular: Normal rate, regular rhythm.  Respiratory: Normal respiratory effort. No wheezes/rales/rhonchi. Gastrointestinal: Active bowel sounds.  Soft and nontender. No distention. Pelvic: Neurologic: Normal  speech and language. No gross focal neurologic deficits are appreciated.  ____________________________________________   LABS Labs Reviewed  CBC WITH DIFFERENTIAL/PLATELET - Abnormal; Notable for the following components:      Result Value   MCHC 37.0 (*)    All other components within normal limits  HCG, QUANTITATIVE, PREGNANCY - Abnormal; Notable for the following components:   hCG, Beta Chain, Quant, S 1,650 (*)    All other components within normal limits  URINALYSIS, COMPLETE (UACMP) WITH MICROSCOPIC - Abnormal; Notable for the following components:   Color, Urine YELLOW (*)    APPearance HAZY (*)    Ketones, ur 5 (*)    All other components within normal limits  COMPREHENSIVE METABOLIC PANEL  ABO/RH    ____________________________________________   RADIOLOGY   Imaging Orders     US OB LESS THAN 14 WEEKS WITH OB TRANSVAGINAL   IMPRESSION: 1. A normal intrauterine gestational sac is not imaged. Heterogeneous fluid collection in the fundal endometrial canal could represent degenerating gestational sac or coexisting gestational sac and subchorionic hemorrhage. 2. Although no adnexal mass is identified, ectopic pregnancy has not been excluded given the quantitative beta HCG. 3. Numerous fibroids, largest 4.5 centimeters. 4. Recommend follow-up quantitative B-HCG levels and follow-up US in 14 or more days to assess viability. This recommendation follows SRU consensus guidelines: Diagnostic Criteria for Nonviable Pregnancy Early in the First Trimester. [place] Med 2013; 300:9233-00.   ____________________________________________   INITIAL IMPRESSION / ASSESSMENT AND PLAN / ED COURSE  Breast Tenderness, Nausea, Pelvic Cramping, Vaginal Bleeding"  DDx include spontaneous abortion, threatened abortion in first trimester of pregnancy, late menses.  CBC, CMET, ABO, serum HCG shows positive pregnancy Urinalysis negative US OB < 14 weeks is concerning for spontaneous abortion She  will follow up with [facility] on Monday for repeat serum HCG levels  ____________________________________________  FINAL CLINICAL IMPRESSION(S) / ED DIAGNOSES  Final diagnoses:  Vaginal bleeding in pregnancy, first trimester      Jearld Fenton, NP 07/21/20 [place]    Lucrezia Starch, MD 07/21/20 1530

## 2020-07-21 NOTE — Discharge Instructions (Addendum)
You were seen today for vaginal bleeding in your first trimester pregnancy.  Your hCG levels were 1605.  Your ultrasound is concerning for a spontaneous miscarriage.  Please avoid strenuous activity.  Follow-up with your OB on Monday for repeat hCG levels.

## 2020-07-21 NOTE — ED Notes (Signed)
See triage note  States she recently found out that she was pregnant  Developed some cramping and vaginal bleeding  States he had to use a tampon for the bleeding

## 2020-07-26 ENCOUNTER — Ambulatory Visit: Payer: Self-pay | Admitting: Obstetrics and Gynecology

## 2020-07-30 ENCOUNTER — Ambulatory Visit: Payer: Self-pay

## 2020-08-04 ENCOUNTER — Emergency Department
Admission: EM | Admit: 2020-08-04 | Discharge: 2020-08-04 | Disposition: A | Discharge status: Home / Self Care | Payer: Self-pay | Attending: Emergency Medicine | Admitting: Emergency Medicine

## 2020-08-04 ENCOUNTER — Other Ambulatory Visit: Payer: Self-pay

## 2020-08-04 ENCOUNTER — Emergency Department: Payer: Self-pay

## 2020-08-04 DIAGNOSIS — O360110 Maternal care for anti-D [Rh] antibodies, first trimester, not applicable or unspecified: Secondary | ICD-10-CM

## 2020-08-04 DIAGNOSIS — R197 Diarrhea, unspecified: Secondary | ICD-10-CM | POA: Insufficient documentation

## 2020-08-04 DIAGNOSIS — Z3A01 Less than 8 weeks gestation of pregnancy: Secondary | ICD-10-CM | POA: Insufficient documentation

## 2020-08-04 DIAGNOSIS — Z20822 Contact with and (suspected) exposure to covid-19: Secondary | ICD-10-CM | POA: Insufficient documentation

## 2020-08-04 DIAGNOSIS — O009 Unspecified ectopic pregnancy without intrauterine pregnancy: Secondary | ICD-10-CM

## 2020-08-04 DIAGNOSIS — J45909 Unspecified asthma, uncomplicated: Secondary | ICD-10-CM | POA: Insufficient documentation

## 2020-08-04 DIAGNOSIS — O219 Vomiting of pregnancy, unspecified: Secondary | ICD-10-CM | POA: Insufficient documentation

## 2020-08-04 DIAGNOSIS — O209 Hemorrhage in early pregnancy, unspecified: Secondary | ICD-10-CM | POA: Diagnosis present

## 2020-08-04 DIAGNOSIS — O99331 Smoking (tobacco) complicating pregnancy, first trimester: Secondary | ICD-10-CM | POA: Insufficient documentation

## 2020-08-04 DIAGNOSIS — O26891 Other specified pregnancy related conditions, first trimester: Secondary | ICD-10-CM | POA: Diagnosis present

## 2020-08-04 DIAGNOSIS — Z6791 Unspecified blood type, Rh negative: Secondary | ICD-10-CM | POA: Diagnosis present

## 2020-08-04 DIAGNOSIS — D259 Leiomyoma of uterus, unspecified: Secondary | ICD-10-CM | POA: Diagnosis present

## 2020-08-04 DIAGNOSIS — O00102 Left tubal pregnancy without intrauterine pregnancy: Secondary | ICD-10-CM

## 2020-08-04 DIAGNOSIS — N939 Abnormal uterine and vaginal bleeding, unspecified: Secondary | ICD-10-CM

## 2020-08-04 DIAGNOSIS — O99511 Diseases of the respiratory system complicating pregnancy, first trimester: Secondary | ICD-10-CM | POA: Insufficient documentation

## 2020-08-04 DIAGNOSIS — K661 Hemoperitoneum: Secondary | ICD-10-CM | POA: Diagnosis present

## 2020-08-04 DIAGNOSIS — D219 Benign neoplasm of connective and other soft tissue, unspecified: Secondary | ICD-10-CM

## 2020-08-04 DIAGNOSIS — F17210 Nicotine dependence, cigarettes, uncomplicated: Secondary | ICD-10-CM | POA: Insufficient documentation

## 2020-08-04 DIAGNOSIS — Z3A08 8 weeks gestation of pregnancy: Secondary | ICD-10-CM

## 2020-08-04 HISTORY — DX: Benign neoplasm of connective and other soft tissue, unspecified: D21.9

## 2020-08-04 LAB — URINALYSIS, COMPLETE (UACMP) WITH MICROSCOPIC
Bacteria, UA: NONE SEEN
Bilirubin Urine: NEGATIVE
Glucose, UA: NEGATIVE mg/dL
Hgb urine dipstick: NEGATIVE
Ketones, ur: 5 mg/dL — AB
Leukocytes,Ua: NEGATIVE
Nitrite: NEGATIVE
Protein, ur: NEGATIVE mg/dL
Specific Gravity, Urine: 1.031 — ABNORMAL HIGH (ref 1.005–1.030)
pH: 6 (ref 5.0–8.0)

## 2020-08-04 LAB — CHLAMYDIA/NGC RT PCR (ARMC ONLY)
Chlamydia Tr: NOT DETECTED
N gonorrhoeae: NOT DETECTED

## 2020-08-04 LAB — COMPREHENSIVE METABOLIC PANEL
ALT: 17 U/L (ref 0–44)
AST: 17 U/L (ref 15–41)
Albumin: 4.6 g/dL (ref 3.5–5.0)
Alkaline Phosphatase: 50 U/L (ref 38–126)
Anion gap: 10 (ref 5–15)
BUN: 13 mg/dL (ref 6–20)
CO2: 24 mmol/L (ref 22–32)
Calcium: 9.1 mg/dL (ref 8.9–10.3)
Chloride: 101 mmol/L (ref 98–111)
Creatinine, Ser: 0.59 mg/dL (ref 0.44–1.00)
GFR, Estimated: >60 mL/min (ref >60)
Glucose, Bld: 112 mg/dL — ABNORMAL HIGH (ref 70–99)
Potassium: 3.6 mmol/L (ref 3.5–5.1)
Sodium: 135 mmol/L (ref 135–145)
Total Bilirubin: 1.2 mg/dL (ref 0.3–1.2)
Total Protein: 8.1 g/dL (ref 6.5–8.1)

## 2020-08-04 LAB — CBC
HCT: 39 % (ref 36.0–46.0)
Hemoglobin: 14.3 g/dL (ref 12.0–15.0)
MCH: 30.8 pg (ref 26.0–34.0)
MCHC: 36.7 g/dL — ABNORMAL HIGH (ref 30.0–36.0)
MCV: 83.9 fL (ref 80.0–100.0)
Platelets: 311 10*3/uL (ref 150–400)
RBC: 4.65 MIL/uL (ref 3.87–5.11)
RDW: 12.4 % (ref 11.5–15.5)
WBC: 6.7 10*3/uL (ref 4.0–10.5)
nRBC: 0 % (ref 0.0–0.2)

## 2020-08-04 LAB — WET PREP, GENITAL
Sperm: NONE SEEN
Trich, Wet Prep: NONE SEEN
Yeast Wet Prep HPF POC: NONE SEEN

## 2020-08-04 LAB — RESP PANEL BY RT-PCR (FLU A&B, COVID) ARPGX2
Influenza A by PCR: NEGATIVE
Influenza B by PCR: NEGATIVE
SARS Coronavirus 2 by RT PCR: NEGATIVE

## 2020-08-04 LAB — TYPE AND SCREEN
ABO/RH(D): A NEG
Antibody Screen: NEGATIVE

## 2020-08-04 LAB — HCG, QUANTITATIVE, PREGNANCY: hCG, Beta Chain, Quant, S: 4480 m[IU]/mL — ABNORMAL HIGH (ref <5)

## 2020-08-04 LAB — LIPASE, BLOOD: Lipase: 32 U/L (ref 11–51)

## 2020-08-04 MED ORDER — OXYCODONE-ACETAMINOPHEN 5-325 MG PO TABS
1.0000 | ORAL_TABLET | Freq: Once | ORAL | Status: AC
Start: 2020-08-04 — End: 2020-08-04
  Administered 2020-08-04: 1 via ORAL
  Filled 2020-08-04: qty 1

## 2020-08-04 MED ORDER — ONDANSETRON 4 MG PO TBDP
4.0000 mg | ORAL_TABLET | Freq: Once | ORAL | Status: AC
  Administered 2020-08-04: 4 mg via ORAL
  Filled 2020-08-04: qty 1

## 2020-08-04 MED ORDER — METHOTREXATE SODIUM CHEMO INJECTION 50 MG/2ML
50.0000 mg/m2 | Freq: Once | INTRAMUSCULAR | Status: AC
  Administered 2020-08-04: 85 mg via INTRAMUSCULAR
  Filled 2020-08-04: qty 3.4

## 2020-08-04 MED ORDER — OXYCODONE HCL 5 MG PO TABS: 5.0000 mg | ORAL_TABLET | Freq: Four times a day (QID) | ORAL | 0 refills | Status: DC | PRN

## 2020-08-04 MED ORDER — RHO D IMMUNE GLOBULIN 1500 UNIT/2ML IJ SOSY
300.0000 ug | PREFILLED_SYRINGE | Freq: Once | INTRAMUSCULAR | Status: AC
  Administered 2020-08-04: 300 ug via INTRAMUSCULAR
  Filled 2020-08-04: qty 2

## 2020-08-04 NOTE — ED Notes (Signed)
Per blood bank, ETA for rhogam is 45 minutes, will update pt and MD

## 2020-08-04 NOTE — Discharge Instructions (Addendum)
Ectopic Pregnancy  An ectopic pregnancy happens when a fertilized egg attaches (implants) outside the uterus. In a normal pregnancy, a fertilized egg implants in the uterus. An ectopic pregnancy cannot develop into a healthy baby. Most ectopic pregnancies occur in one of the fallopian tubes, which is where an egg travels from an ovary to get to the uterus. This is called a tubal pregnancy. An ectopic pregnancy can also happen on an ovary, on the cervix, or in the abdomen. When a fertilized egg implants on tissue outside the uterus and begins to grow, it may cause the tissue to tear or burst. This is known as a ruptured ectopic pregnancy. The tear or burst causes internal bleeding. This may cause intense pain in the abdomen. An ectopic pregnancy is a medical emergency and can be life-threatening. What are the causes? The most common cause of this condition is damage to one of the fallopian tubes. A fallopian tube may be narrowed or blocked, and that stops the fertilized egg from reaching the uterus. Sometimes, the cause of this condition is not known. What increases the risk? The following factors may make you more likely to develop this condition:  Having gone through infertility treatment before.  Having had an ectopic pregnancy before.  Having had surgery to have the fallopian tubes tied.  Becoming pregnant while using an intrauterine device for birth control.  Taking birth control pills before the age of 16. Other risk factors include:  Smoking.  Alcohol use.  History of DES exposure. DES is a medicine that was used until 1971 and affected babies whose mothers took the medicine. What are the signs or symptoms? Common symptoms of this condition include:  Missing a menstrual period.  Nausea or tiredness.  Tender breasts.  Other normal pregnancy symptoms. Other symptoms may include:  Pain during sex.  Vaginal bleeding or spotting.  Cramping or pain in the lower abdomen.  A  fast heartbeat, low blood pressure, and sweating.  Pain or increased pressure while having a bowel movement. Symptoms of a ruptured ectopic pregnancy and internal bleeding may include:  Sudden, severe pain in the abdomen.  Dizziness, weakness, feeling light-headed, or fainting.  Pain in the shoulder or neck area. How is this diagnosed? This condition is diagnosed by:  A blood test to check for the pregnancy hormone.  A pelvic exam to find painful areas or a mass in the abdomen.  Ultrasound. A probe is inserted into the vagina to see if there is a pregnancy in or outside the uterus.  Taking a sample of tissue from the uterus.  Surgery to look closely at the fallopian tubes through an incision in the abdomen. How is this treated? This condition is usually treated with medicine or surgery. Sometimes, ectopic pregnancies can resolve on their own, under close monitoring by your health care provider. Medicine A medicine called methotrexate may be given to cause the pregnancy tissue to be absorbed. The medicine may be given if:  The diagnosis is made early, with no signs of active bleeding.  The fallopian tube has not torn or burst. You will need blood tests to make sure the medicine is working. It may take 4-6 weeks for the pregnancy tissues to be absorbed. Surgery Surgery may be performed to:  Remove the pregnancy tissue.  Stop internal bleeding.  Remove part or all of the fallopian tube.  Remove the uterus. This is rare. After surgery, you may need to have blood tests to make sure the surgery worked.   Follow these instructions at home: Medicines  Take over-the-counter and prescription medicines only as told by your health care provider.  Ask your health care provider if the medicine prescribed to you: ? Requires you to avoid driving or using machinery. ? Can cause constipation. You may need to take these actions to prevent or treat constipation:  Drink enough fluid to  keep your urine pale yellow.  Take over-the-counter or prescription medicines.  Eat foods that are high in fiber, such as beans, whole grains, and fresh fruits and vegetables.  Limit foods that are high in fat and processed sugars, such as fried or sweet foods. General instructions  Rest or limit your activity, if told by your health care provider.  Do not have sex or put anything in your vagina, such as tampons or douches, for 6 weeks or until your health care provider says it is safe.  Do not lift anything that is heavier than 10 lb (4.5 kg), or the limit that you are told, until your health care provider says that it is safe.  Return to your normal activities as told by your health care provider. Ask your health care provider what activities are safe for you.  Keep all follow-up visits. This is important. Contact a health care provider if:  You have a fever or chills.  You have nausea and vomiting. Get help right away if:  Your pain gets worse or is not relieved by medicine.  You feel dizzy or weak.  You feel light-headed or you faint.  You have sudden, severe pain in your abdomen.  You have sudden pain in the shoulder or neck area. Summary  An ectopic pregnancy happens when a fertilized egg implants outside the uterus. Most ectopic pregnancies occur in one of the fallopian tubes.  An ectopic pregnancy is a medical emergency and can be life-threatening.  The most common cause of this condition is damage to one of the fallopian tubes.  This condition is usually treated with medicine or surgery. Some ectopic pregnancies resolve on their own, under close monitoring by your health care provider. This information is not intended to replace advice given to you by your health care provider. Make sure you discuss any questions you have with your health care provider. Document Revised: 07/05/2019 Document Reviewed: 07/05/2019 Elsevier Patient Education  [place].    Methotrexate Treatment for an Ectopic Pregnancy Methotrexate is a medicine that treats an ectopic pregnancy. In this type of pregnancy, the fertilized egg attaches (implants) outside the uterus. An ectopic pregnancy cannot develop into a healthy baby. Methotrexate works by stopping the growth of the fertilized egg. It also helps the body absorb tissue from the egg. This takes about 2-6 weeks. An ectopic pregnancy can be life-threatening. However, most ectopic pregnancies can be successfully treated with methotrexate if they are diagnosed early. Tell a health care provider about:  Any allergies you have.  All medicines you are taking, including vitamins, herbs, eye drops, creams, and over-the-counter medicines.  Any medical conditions you have. What are the risks? Generally, this is a safe treatment. However, problems may occur, including:  Digestive problems. You may have: ? Nausea. ? Vomiting. ? Diarrhea. ? Cramping in your abdomen.  Bleeding or spotting from your vagina.  Feeling dizzy or light-headed.  Mouth sores.  Inflammation of the lining of your lungs (pneumonitis).  Damage to nearby structures or organs, such as damage to the liver.  Hair loss. There is a risk that methotrexate treatment  will fail and the pregnancy will continue. There is also a risk that the ectopic pregnancy might tear or burst (rupture) during use of this medicine. What happens before the procedure?  Blood tests will be done to check how your disease-fighting system (immune system), liver, and kidneys are working.  You will also have blood tests to measure your pregnancy hormone levels and to find out your blood type.  You will be given a shot of a medicine called Rho(D) immune globulin if: ? You are Rh-negative and the father is Rh-positive. ? You are Rh-negative and the father's Rh type is unknown. What happens during the procedure?  Methotrexate will be injected into your  muscle. ? Methotrexate may be given as a single dose of medicine or a series of doses over time, depending on your response to the treatment. ? Methotrexate injections are given by a health care provider. Injection is the most common way that this medicine is used to treat an ectopic pregnancy.  You may also receive other medicines to manage your ectopic pregnancy. The procedure may vary among health care providers and hospitals. What can I expect after treatment? After your treatment, it is common to have:  Cramping in your abdomen.  Bleeding in your vagina.  Tiredness (fatigue).  Nausea.  Vomiting.  Diarrhea. Blood tests will be done at timed intervals for several days or weeks to check your pregnancy hormone levels. The blood tests will be done until the pregnancy hormone can no longer be found in the blood. If the methotrexate treatment does not work, a surgical procedure may be done to remove the ectopic pregnancy. Follow these instructions at home: Medicines  Take over-the-counter and prescription medicines only as told by your health care provider.  Do not take prescription pain medicines, aspirin, ibuprofen, naproxen, or any other NSAIDs.  Do not take folic acid, prenatal vitamins, or other vitamins that contain folic acid. Activity  Do not have sex, douche, or put anything, such as tampons, in your vagina until your health care provider says it is okay.  Limit activities that take a lot of effort as told by your health care provider. General instructions  Do not drink alcohol.  Follow instructions from your health care provider about eating restrictions, such as avoiding foods that produce a lot of gas. These foods can hide the signs of a ruptured ectopic pregnancy.  Limit exposure to sunlight or artificial UV light such as from tanning beds. Methotrexate can make you more sensitive to the sun.  Follow instructions from your health care provider on how and when to  report any symptoms that may indicate a ruptured ectopic pregnancy.  Keep all follow-up visits. This is important.   Contact a health care provider if:  You have persistent nausea and vomiting.  You have persistent diarrhea.  You are having a reaction to the medicine. This may include: ? Unusual fatigue. ? Skin rash. Get help right away if:  Pain in your abdomen or in the area between your hip bones (pelvic area) gets worse.  You have more bleeding from your vagina.  You feel light-headed or you faint.  You are short of breath.  Your heart rate increases.  You develop a cough.  You have chills or a fever. Summary  Methotrexate is a medicine that treats an ectopic pregnancy. This type of pregnancy forms outside the uterus.  There is a risk that methotrexate treatment will fail and the pregnancy will continue. There is also a  risk that the ectopic pregnancy might tear or burst during use of this medicine.  This medicine may be given in a single dose or a series of doses over time.  After your treatment, blood tests will be done at timed intervals for several days or weeks to check your pregnancy hormone levels. The blood tests will be done until no more pregnancy hormone is found in the blood. This information is not intended to replace advice given to you by your health care provider. Make sure you discuss any questions you have with your health care provider. Document Revised: 09/07/2019 Document Reviewed: 09/07/2019 Elsevier Patient Education  2021 [employer].

## 2020-08-04 NOTE — ED Triage Notes (Signed)
Pt to ER via POV with complaints of nausea and vomiting that started this morning. Reports bad heart burn.   States she also believes she is having a miscarriage, has been having vaginal bleeding. States she has been bleeding for ten days, it is now light/ pink tinged. States on 4/20 she believes she passed the tissue, has images on her phone. Also endorses abdominal cramping. Reports she has had a miscarriage before.

## 2020-08-04 NOTE — ED Provider Notes (Signed)
[facility] Emergency Department Provider Note   ____________________________________________   Event Date/Time   First MD Initiated Contact with Patient 08/04/20 1505     (approximate)  I have reviewed the triage vital signs and the nursing notes.   HISTORY  Chief Complaint Abdominal Pain    HPI Brenda Aguirre is a 25 y.o. female, G4 P0-0-3-0 at approximately 8 weeks of pregnancy who presents to the ED complaining of vaginal bleeding and nausea.  Patient reports that she initially found out she was pregnant earlier this month following an ED visit for vaginal bleeding.  At that time, her ultrasound showed possible failed pregnancy and she was discharged home with plan for OB/GYN follow-up.  She has not yet been seen by OB/GYN, states bleeding slightly worsened since then and she passed a significant amount of tissue 10 days ago.  Bleeding has eased up since then but she continues to have light spotting.  She denies any abdominal pain, dysuria, or fevers.  She does state that she has felt nauseous with a couple episodes of vomiting and diarrhea over the past couple of days.        Past Medical History:  Diagnosis Date  . Asthma   . Fibroids 08/04/2020    Patient Active Problem List   Diagnosis Date Noted  . Ectopic pregnancy without intrauterine pregnancy 08/04/2020  . Vaginal bleeding affecting early pregnancy 08/04/2020  . Uterine fibroid 08/04/2020  . Rh negative state in antepartum period, first trimester 08/04/2020  . Cigar smoker 2/day + vapes 02/21/2020  . Marijuana use 02/21/2020  . Syphilis 02/07/2019  . Overweight 01/06/2018    History reviewed. No pertinent surgical history.  Prior to Admission medications   Medication Sig Start Date End Date Taking? Authorizing Provider  oxyCODONE (ROXICODONE) 5 MG immediate release tablet Take 1 tablet (5 mg total) by mouth every 6 (six) hours as needed for moderate pain or severe pain (May take  up to 2 tablets for severe pain). 08/04/20 08/04/21 Yes Rubie Maid, MD  albuterol (PROVENTIL) (2.5 MG/3ML) 0.083% nebulizer solution Take 3 mLs (2.5 mg total) by nebulization every 6 (six) hours as needed for wheezing or shortness of breath. 08/11/16   Johnn Hai, PA-C  albuterol (VENTOLIN HFA) 108 (90 Base) MCG/ACT inhaler Inhale 2-4 puffs into the lungs every 4 (four) hours as needed for wheezing or shortness of breath. 11/14/18   Hinda Kehr, MD    Allergies Patient has no known allergies.  Family History  Problem Relation Age of Onset  . Hypertension Maternal Grandmother     Social History Social History   Tobacco Use  . Smoking status: Current Every Day Smoker    Types: Cigars, E-cigarettes, Cigarettes  . Smokeless tobacco: Never Used  . Tobacco comment: Smoking x4 years  Vaping Use  . Vaping Use: Some days  . Start date: 09/29/2018  Substance Use Topics  . Alcohol use: Yes    Alcohol/week: 7.0 standard drinks    Types: 3 Glasses of wine, 4 Shots of liquor per week  . Drug use: Yes    Frequency: 3.0 times per week    Types: Marijuana    Review of Systems  Constitutional: No fever/chills Eyes: No visual changes. ENT: No sore throat. Cardiovascular: Denies chest pain. Respiratory: Denies shortness of breath. Gastrointestinal: No abdominal pain.  Positive for nausea, vomiting, and diarrhea.  No constipation. Genitourinary: Negative for dysuria.  Positive for vaginal bleeding. Musculoskeletal: Negative for back pain. Skin: Negative  for rash. Neurological: Negative for headaches, focal weakness or numbness.  ____________________________________________   PHYSICAL EXAM:  VITAL SIGNS: ED Triage Vitals [08/04/20 1328]  Enc Vitals Group     BP 120/76     Pulse Rate 91     Resp 18     Temp 98.6 F (37 C)     Temp Source Oral     SpO2 100 %     Weight 152 lb (68.9 kg)     Height 5\' 1"  (1.549 m)     Head Circumference      Peak Flow      Pain Score 1      Pain Loc      Pain Edu?      Excl. in [place]?     Constitutional: Alert and oriented. Eyes: Conjunctivae are normal. Head: Atraumatic. Nose: No congestion/rhinnorhea. Mouth/Throat: Mucous membranes are moist. Neck: Normal ROM Cardiovascular: Normal rate, regular rhythm. Grossly normal heart sounds. Respiratory: Normal respiratory effort.  No retractions. Lungs CTAB. Gastrointestinal: Soft and nontender. No distention. Genitourinary: Scant vaginal bleeding with close cervical os, no cervical motion or tenderness. Musculoskeletal: No lower extremity tenderness nor edema. Neurologic:  Normal speech and language. No gross focal neurologic deficits are appreciated. Skin:  Skin is warm, dry and intact. No rash noted. Psychiatric: Mood and affect are normal. Speech and behavior are normal.  ____________________________________________   LABS (all labs ordered are listed, but only abnormal results are displayed)  Labs Reviewed  WET PREP, GENITAL - Abnormal; Notable for the following components:      Result Value   Clue Cells Wet Prep HPF POC PRESENT (*)    WBC, Wet Prep HPF POC FEW (*)    All other components within normal limits  COMPREHENSIVE METABOLIC PANEL - Abnormal; Notable for the following components:   Glucose, Bld 112 (*)    All other components within normal limits  CBC - Abnormal; Notable for the following components:   MCHC 36.7 (*)    All other components within normal limits  URINALYSIS, COMPLETE (UACMP) WITH MICROSCOPIC - Abnormal; Notable for the following components:   Color, Urine YELLOW (*)    APPearance HAZY (*)    Specific Gravity, Urine 1.031 (*)    Ketones, ur 5 (*)    All other components within normal limits  HCG, QUANTITATIVE, PREGNANCY - Abnormal; Notable for the following components:   hCG, Beta Chain, Quant, S 4,480 (*)    All other components within normal limits  CHLAMYDIA/NGC RT PCR ([facility] ONLY)  RESP PANEL BY RT-PCR (FLU A&B, COVID)  ARPGX2  LIPASE, BLOOD  RH IG WORKUP (INCLUDES ABO/RH)  TYPE AND SCREEN  RHOGAM INJECTION    PROCEDURES  Procedure(s) performed (including Critical Care):  Procedures   ____________________________________________   INITIAL IMPRESSION / ASSESSMENT AND PLAN / ED COURSE       25 year old female, G4 P0-0-3-0 at approximately 8 weeks of pregnancy who presents to the ED complaining of ongoing vaginal bleeding after having ultrasound concerning for failed pregnancy earlier this month.  She denies any associated abdominal pain and has not had any fevers, does report recent nausea, vomiting, and diarrhea.  We will further assess for ectopic pregnancy versus miscarriage with ultrasound, wet prep and GC/chlamydia are pending.  She has no abdominal tenderness on exam and low suspicion for septic abortion at this time.  Additional labs are reassuring, her vomiting and diarrhea sounds likely to be related to a gastroenteritis.  We will give oral dose  of Zofran and reassess following ultrasound.  Pelvic ultrasound is concerning for left adnexal ectopic pregnancy.  Case discussed with Dr. Marcelline Mates of OB/GYN, who evaluated the patient at the bedside.  Patient has opted for treatment with methotrexate and she received this along with a dose of RhoGAM given her Rh- status.  She is appropriate for discharge home with close OB/GYN follow-up, was counseled to return to the ED for new or worsening symptoms, patient agrees with plan.      ____________________________________________   FINAL CLINICAL IMPRESSION(S) / ED DIAGNOSES  Final diagnoses:  Ectopic pregnancy without intrauterine pregnancy, unspecified location     ED Discharge Orders         Ordered    oxyCODONE (ROXICODONE) 5 MG immediate release tablet  Every 6 hours PRN        08/04/20 1854           Note:  This document was prepared using Dragon voice recognition software and may include unintentional dictation errors.   Blake Divine, MD 08/04/20 2116

## 2020-08-04 NOTE — Consult Note (Addendum)
Reason for Consult:Ectopic pregnancy Referring Physician: Blake Divine, MD (ER Physician)  Brenda Aguirre is an 25 y.o. G41P0030 female who is 8.[redacted] weeks gestation by LMP of 06/05/2020 who presented to the Emergency Room with complaints of vaginal bleeding, nausea, and pelvic cramping x 4 days.  Of note, patient was seen in the ER 2 weeks ago for similar complaints and was told at that time that she may possibly be having a miscarriage based on her labs and ultrasound at that time of her evaluation. She denies significant pain, bleeding is described as initially light brown which changed bright red and become slightly heavier over the past few days.  She also thinks that she may have passed some tissue products several days ago, which also made her think she was having a miscarriage. She denies vaginal discharge, urinary symptoms.   Of note, patient was instructed to follow up after last ER visit with an OB/GYN (currently does not have one), however she notes that she did not as she had experienced miscarriages before and felt that she could manage at home herself.    Pertinent Gynecological History: Menses: flow is moderate, regular every month without intermenstrual spotting, usually lasting less than 6 days and with minimal cramping Contraception: none Blood transfusions: none Sexually transmitted diseases: past history: Chlamydia, Gonorrhea, Syphyillis.  Previous GYN Procedures: No  Last pap: normal Date: cannot recall   Menstrual History: Menarche age: 81 or 41 Patient's last menstrual period was 06/05/2020 (exact date).    OB History  Gravida Para Term Preterm AB Living  4 0 0 0 3 0  SAB IAB Ectopic Multiple Live Births  2 1 0 0 0    # Outcome Date GA Lbr Len/2nd Weight Sex Delivery Anes PTL Lv  4 Current           3 SAB 08/2019          2 SAB 2020          1 IAB 2014            Past Medical History:  Diagnosis Date  . Asthma     History reviewed. No pertinent surgical  history.  Family History  Problem Relation Age of Onset  . Hypertension Maternal Grandmother     Social History:  reports that she has been smoking cigars, e-cigarettes, and cigarettes. She has never used smokeless tobacco. She reports current alcohol use of about 7.0 standard drinks of alcohol per week. She reports current drug use. Frequency: 3.00 times per week. Drug: Marijuana.  Allergies: No Known Allergies  Medications: Prior to Admission: (Not in a hospital admission)   Review of Systems  Constitutional: Negative for appetite change, chills and fever.  HENT: Negative.   Eyes: Negative.   Respiratory: Negative.   Cardiovascular: Negative.   Gastrointestinal: Positive for nausea. Negative for abdominal pain, constipation, diarrhea and vomiting.  Endocrine: Negative.   Genitourinary: Positive for vaginal bleeding. Negative for dysuria, frequency, pelvic pain and vaginal discharge.  Musculoskeletal: Negative.   Allergic/Immunologic: Negative.   Neurological: Negative for dizziness, syncope, weakness and light-headedness.  Hematological: Negative.   Psychiatric/Behavioral: Negative.     Blood pressure 114/80, pulse 76, temperature 98.6 F (37 C), temperature source Oral, resp. rate 18, height 5\' 1"  (1.549 m), weight 68.9 kg, last menstrual period 06/05/2020, SpO2 98 %. Physical Exam Constitutional:      General: She is in acute distress.     Appearance: She is well-developed.  HENT:  Head: Normocephalic and atraumatic.  Eyes:     Extraocular Movements: Extraocular movements intact.     Pupils: Pupils are equal, round, and reactive to light.  Cardiovascular:     Rate and Rhythm: Normal rate and regular rhythm.     Heart sounds: Normal heart sounds. No murmur heard.   Pulmonary:     Effort: Pulmonary effort is normal. No respiratory distress.     Breath sounds: Normal breath sounds.  Abdominal:     General: Abdomen is flat. Bowel sounds are normal. There is no  distension.     Palpations: Abdomen is soft. There is no mass.     Tenderness: There is no abdominal tenderness.  Genitourinary:    Vagina: Bleeding present.     Comments: Deferred repeat exam today.  Skin:    General: Skin is warm and dry.  Neurological:     General: No focal deficit present.     Mental Status: She is alert.  Psychiatric:        Mood and Affect: Mood normal.        Behavior: Behavior normal.    Labs:  Results for orders placed or performed during the hospital encounter of 08/04/20 (from the past 48 hour(s))  Lipase, blood     Status: None   Collection Time: 08/04/20  1:32 PM  Result Value Ref Range   Lipase 32 11 - 51 U/L    Comment: Performed at [facility], [address] [place], [place] [address]  Comprehensive metabolic panel     Status: Abnormal   Collection Time: 08/04/20  1:32 PM  Result Value Ref Range   Sodium 135 135 - 145 mmol/L   Potassium 3.6 3.5 - 5.1 mmol/L   Chloride 101 98 - 111 mmol/L   CO2 24 22 - 32 mmol/L   Glucose, Bld 112 (H) 70 - 99 mg/dL    Comment: Glucose reference range applies only to samples taken after fasting for at least 8 hours.   BUN 13 6 - 20 mg/dL   Creatinine, Ser 0.59 0.44 - 1.00 mg/dL   Calcium 9.1 8.9 - 10.3 mg/dL   Total Protein 8.1 6.5 - 8.1 g/dL   Albumin 4.6 3.5 - 5.0 g/dL   AST 17 15 - 41 U/L   ALT 17 0 - 44 U/L   Alkaline Phosphatase 50 38 - 126 U/L   Total Bilirubin 1.2 0.3 - 1.2 mg/dL   GFR, Estimated >60 >60 mL/min    Comment: (NOTE) Calculated using the CKD-EPI Creatinine Equation (2021)    Anion gap 10 5 - 15    Comment: Performed at [facility], [address] [place], [place] [address]  CBC     Status: Abnormal   Collection Time: 08/04/20  1:32 PM  Result Value Ref Range   WBC 6.7 4.0 - 10.5 K/uL   RBC 4.65 3.87 - 5.11 MIL/uL   Hemoglobin 14.3 12.0 - 15.0 g/dL   HCT 39.0 36.0 - 46.0 %   MCV 83.9 80.0 - 100.0 fL   MCH 30.8 26.0 - 34.0 pg   MCHC 36.7 (H) 30.0 - 36.0  g/dL   RDW 12.4 11.5 - 15.5 %   Platelets 311 150 - 400 K/uL   nRBC 0.0 0.0 - 0.2 %    Comment: Performed at [facility], [address] [place],  [address]  hCG, quantitative, pregnancy     Status: Abnormal   Collection Time: 08/04/20  1:32 PM  Result Value Ref Range   hCG, Beta Chain, Quant, S 4,480 (H) <5 mIU/mL    Comment:          GEST. AGE      CONC.  (mIU/mL)   <=1 WEEK        5 - 50     2 WEEKS       50 - 500     3 WEEKS       100 - 10,000     4 WEEKS     1,000 - 30,000     5 WEEKS     3,500 - 115,000   6-8 WEEKS     12,000 - 270,000    12 WEEKS     15,000 - 220,000        FEMALE AND NON-PREGNANT FEMALE:     LESS THAN 5 mIU/mL Performed at [facility], [address] [place], [place] [address]   Wet prep, genital     Status: Abnormal   Collection Time: 08/04/20  3:30 PM  Result Value Ref Range   Yeast Wet Prep HPF POC NONE SEEN NONE SEEN   Trich, Wet Prep NONE SEEN NONE SEEN   Clue Cells Wet Prep HPF POC PRESENT (A) NONE SEEN   WBC, Wet Prep HPF POC FEW (A) NONE SEEN   Sperm NONE SEEN     Comment: Performed at [facility], [address], [place], [place] [address]  [place] rt PCR [facility] only)     Status: None   Collection Time: 08/04/20  3:30 PM  Result Value Ref Range   Specimen source GC/Chlam ENDOCERVICAL    Chlamydia Tr NOT DETECTED NOT DETECTED   N gonorrhoeae NOT DETECTED NOT DETECTED    Comment: (NOTE) This CT/NG assay has not been evaluated in patients with a history of  hysterectomy. Performed at [facility], [address] [place], [place] [address]   Urinalysis, Complete w Microscopic Urine, Clean Catch     Status: Abnormal   Collection Time: 08/04/20  5:32 PM  Result Value Ref Range   Color, Urine YELLOW (A) YELLOW   APPearance HAZY (A) CLEAR   Specific Gravity, Urine 1.031 (H) 1.005 - 1.030   pH 6.0 5.0 - 8.0   Glucose, UA NEGATIVE NEGATIVE mg/dL   Hgb urine dipstick NEGATIVE NEGATIVE    Bilirubin Urine NEGATIVE NEGATIVE   Ketones, ur 5 (A) NEGATIVE mg/dL   Protein, ur NEGATIVE NEGATIVE mg/dL   Nitrite NEGATIVE NEGATIVE   Leukocytes,Ua NEGATIVE NEGATIVE   RBC / HPF 0-5 0 - 5 RBC/hpf   WBC, UA 0-5 0 - 5 WBC/hpf   Bacteria, UA NONE SEEN NONE SEEN   Squamous Epithelial / LPF 0-5 0 - 5   Mucus PRESENT     Comment: Performed at [facility], [address], [place], [place] [address]    RADIOLOGY  [place] LESS THAN 14 WEEKS WITH [place] TRANSVAGINAL  Result Date: 08/04/2020 CLINICAL DATA:  Vaginal bleeding.  Beta HCG greater than 4,000. EXAM: OBSTETRIC <14 WK [place] AND TRANSVAGINAL OB US TECHNIQUE: Both transabdominal and transvaginal ultrasound examinations were performed for complete evaluation of the gestation as well as the maternal uterus, adnexal regions, and pelvic cul-de-sac. Transvaginal technique was performed to assess early pregnancy. COMPARISON:  None. FINDINGS: Intrauterine gestational sac: Not Seen Yolk sac:  Not Seen Embryo:  Not Seen Cardiac Activity: Not Seen Maternal uterus/adnexae: Endometrial complex is normal in thickness. No mass or fluid within  the endometrial canal. No intrauterine gestational sac. Multiple fibroids, largest within the lateral fundal portion of the uterus measuring 4.2 cm. RIGHT ovary appears normal and there is no mass or free fluid seen within the RIGHT adnexal region. Echogenic mass is identified within the LEFT adnexal region, measuring 2.5 x 1.6 x 1.8 cm, highly suspicious for topic pregnancy, separate from a normal-appearing LEFT ovary. IMPRESSION: 1. Probable ectopic pregnancy within the LEFT adnexal region, separate from the normal-appearing LEFT ovary, measuring 2.5 cm. No free fluid/hemorrhage is identified in the LEFT adnexal region to suggest ectopic rupture. 2. Normal-appearing RIGHT ovary. No mass or free fluid in the RIGHT adnexal region. 3. No intrauterine gestational sac. 4. Multiple fibroids, largest measuring 4.2 cm. Critical  Value/emergent results were called by telephone at the time of interpretation on 08/04/2020 at 5:02 pm to provider [facility] , who verbally acknowledged these results. Electronically Signed   By: Franki Cabot M.D.   On: 08/04/2020 17:02      US OB LESS THAN 14 WEEKS WITH OB TRANSVAGINAL   Result date: 07/21/2020 IMPRESSION: 1. A normal intrauterine gestational sac is not imaged. Heterogeneous fluid collection in the fundal endometrial canal could represent degenerating gestational sac or coexisting gestational sac and subchorionic hemorrhage. 2. Although no adnexal mass is identified, ectopic pregnancy has not been excluded given the quantitative beta HCG. 3. Numerous fibroids, largest 4.5 centimeters. 4. Recommend follow-up quantitative B-HCG levels and follow-up US in 14 or more days to assess viability. This recommendation follows SRU consensus guidelines: Diagnostic Criteria for Nonviable Pregnancy Early in the First Trimester. [place] Med 2013; 630:1601-09.    Assessment/Plan: 1. Suspected ectopic pregnancy (left) - Patient with likely ectopic pregnancy based on today's imaging and trending BHCG levels (was 1650 on 07/21/2020, currently 4,480).   - Discussed options for management of ectopic pregnancy, including medical management with Methotrexate vs unilateral laparoscopic salpingostomy/salpingectomy.  She is not a candidate for expectant management. Risks and benefits of all modalities discussed; medical management risks discussed included anticipated success rate, the need for another person to be at home with her, and to call/come in if she had heavy bleeding, dizziness, or severe pain not relieved by medication. She also understands the risk of tubal rupture and need for subsequent emergency surgery with possible loss of fallopian tube. Risks of surgery were noted, including bleeding, infection, injury to surrounding organs, need for additional procedures, possibility of intrauterine  scarring which may impair future fertility, and other postoperative/anesthesia complications havebe explained to patient. all questions answered.  Patient opted for medical management for now.  Bleeding and pain precautions reviewed; she was told to call clinic or come to Emergency Room for any concerns. On exam today, she had stable vital signs, and benign abdomen/pelvis and was deemed a good candidate for medical management.  Will administer dose today.  Extensively emphasized importance of follow up on Days #4 and 7 for repeat BHCG levels. Will f/u at [facility] on Day #4 (Wednesday).   2. Rh negative status - Does note appear that patient received Rhogam with initial encounter 2 weeks ago.  Is A Neg blood type. Will repeat type and screen and administer Rhogam today. Discussed need for Rhogam in each pregnancy as bleeding occurs  3. Fibroid uterus - Incidental finding on last ultrasound. Discussed results with patient. Notes that she was unaware, but that this runs in her family. Has never had any symptoms that she is aware of.    Rubie Maid, MD  08/04/2020   

## 2020-08-06 LAB — RHOGAM INJECTION: Unit division: 0

## 2020-08-08 ENCOUNTER — Ambulatory Visit (INDEPENDENT_AMBULATORY_CARE_PROVIDER_SITE_OTHER): Payer: Self-pay | Admitting: Obstetrics and Gynecology

## 2020-08-08 ENCOUNTER — Other Ambulatory Visit: Payer: Self-pay

## 2020-08-08 ENCOUNTER — Encounter: Payer: Self-pay | Admitting: Obstetrics and Gynecology

## 2020-08-08 VITALS — BP 104/73 | HR 90 | Ht 61.0 in | Wt 151.2 lb

## 2020-08-08 DIAGNOSIS — N96 Recurrent pregnancy loss: Secondary | ICD-10-CM

## 2020-08-08 DIAGNOSIS — D259 Leiomyoma of uterus, unspecified: Secondary | ICD-10-CM

## 2020-08-08 DIAGNOSIS — O039 Complete or unspecified spontaneous abortion without complication: Secondary | ICD-10-CM

## 2020-08-08 DIAGNOSIS — O00102 Left tubal pregnancy without intrauterine pregnancy: Secondary | ICD-10-CM

## 2020-08-08 NOTE — Patient Instructions (Signed)
Methotrexate Treatment for an Ectopic Pregnancy Methotrexate is a medicine that treats an ectopic pregnancy. In this type of pregnancy, the fertilized egg attaches (implants) outside the uterus. An ectopic pregnancy cannot develop into a healthy baby. Methotrexate works by stopping the growth of the fertilized egg. It also helps the body absorb tissue from the egg. This takes about 2-6 weeks. An ectopic pregnancy can be life-threatening. However, most ectopic pregnancies can be successfully treated with methotrexate if they are diagnosed early. Tell a health care provider about:  Any allergies you have.  All medicines you are taking, including vitamins, herbs, eye drops, creams, and over-the-counter medicines.  Any medical conditions you have. What are the risks? Generally, this is a safe treatment. However, problems may occur, including:  Digestive problems. You may have: ? Nausea. ? Vomiting. ? Diarrhea. ? Cramping in your abdomen.  Bleeding or spotting from your vagina.  Feeling dizzy or light-headed.  Mouth sores.  Inflammation of the lining of your lungs (pneumonitis).  Damage to nearby structures or organs, such as damage to the liver.  Hair loss. There is a risk that methotrexate treatment will fail and the pregnancy will continue. There is also a risk that the ectopic pregnancy might tear or burst (rupture) during use of this medicine. What happens before the procedure?  Blood tests will be done to check how your disease-fighting system (immune system), liver, and kidneys are working.  You will also have blood tests to measure your pregnancy hormone levels and to find out your blood type.  You will be given a shot of a medicine called Rho(D) immune globulin if: ? You are Rh-negative and the father is Rh-positive. ? You are Rh-negative and the father's Rh type is unknown. What happens during the procedure?  Methotrexate will be injected into your  muscle. ? Methotrexate may be given as a single dose of medicine or a series of doses over time, depending on your response to the treatment. ? Methotrexate injections are given by a health care provider. Injection is the most common way that this medicine is used to treat an ectopic pregnancy.  You may also receive other medicines to manage your ectopic pregnancy. The procedure may vary among health care providers and hospitals. What can I expect after treatment? After your treatment, it is common to have:  Cramping in your abdomen.  Bleeding in your vagina.  Tiredness (fatigue).  Nausea.  Vomiting.  Diarrhea. Blood tests will be done at timed intervals for several days or weeks to check your pregnancy hormone levels. The blood tests will be done until the pregnancy hormone can no longer be found in the blood. If the methotrexate treatment does not work, a surgical procedure may be done to remove the ectopic pregnancy. Follow these instructions at home: Medicines  Take over-the-counter and prescription medicines only as told by your health care provider.  Do not take prescription pain medicines, aspirin, ibuprofen, naproxen, or any other NSAIDs.  Do not take folic acid, prenatal vitamins, or other vitamins that contain folic acid. Activity  Do not have sex, douche, or put anything, such as tampons, in your vagina until your health care provider says it is okay.  Limit activities that take a lot of effort as told by your health care provider. General instructions  Do not drink alcohol.  Follow instructions from your health care provider about eating restrictions, such as avoiding foods that produce a lot of gas. These foods can hide the signs of a   ruptured ectopic pregnancy.  Limit exposure to sunlight or artificial UV light such as from tanning beds. Methotrexate can make you more sensitive to the sun.  Follow instructions from your health care provider on how and when to  report any symptoms that may indicate a ruptured ectopic pregnancy.  Keep all follow-up visits. This is important.   Contact a health care provider if:  You have persistent nausea and vomiting.  You have persistent diarrhea.  You are having a reaction to the medicine. This may include: ? Unusual fatigue. ? Skin rash. Get help right away if:  Pain in your abdomen or in the area between your hip bones (pelvic area) gets worse.  You have more bleeding from your vagina.  You feel light-headed or you faint.  You are short of breath.  Your heart rate increases.  You develop a cough.  You have chills or a fever. Summary  Methotrexate is a medicine that treats an ectopic pregnancy. This type of pregnancy forms outside the uterus.  There is a risk that methotrexate treatment will fail and the pregnancy will continue. There is also a risk that the ectopic pregnancy might tear or burst during use of this medicine.  This medicine may be given in a single dose or a series of doses over time.  After your treatment, blood tests will be done at timed intervals for several days or weeks to check your pregnancy hormone levels. The blood tests will be done until no more pregnancy hormone is found in the blood. This information is not intended to replace advice given to you by your health care provider. Make sure you discuss any questions you have with your health care provider. Document Revised: 09/07/2019 Document Reviewed: 09/07/2019 Elsevier Patient Education  2021 [employer]  

## 2020-08-08 NOTE — Progress Notes (Signed)
GYNECOLOGY PROGRESS NOTE  Subjective:    Patient ID: Brenda Aguirre, female    DOB: 03-10-1996, 25 y.o.   MRN: [id number]  HPI  Patient is a 25 y.o. G57P0030 female who presents for follow-up from Emergency Room visit on 08/04/2020 for left ectopic pregnancy.  Patient initially presented with complaints of vaginal bleeding and passage of clots, possible passage of tissue.  Of note she had been seen in the emergency room approximately 2 weeks prior with complaints of vaginal bleeding at that time which was lighter.  At the time of her initial visit, no IUP was seen, and a heterogeneous fluid collection was noted in the uterus.  Beta hCG level was approximately 1650.  Patient was instructed to follow-up with an OB/GYN, however she reports that she did not as she assumed it was another miscarriage and thought she could manage at home.  On her return to the Emergency Room her beta hCG level was 4480, and there was a mass suspicious for right ectopic pregnancy noted on repeat ultrasound.  Was counseled on treatment options and decided on methotrexate therapy.  Today reports that she is feeling well, denies any abdominal pain.  Does note some occasional nausea however is very mild.  She also notes that her bleeding is minimal at this time.  The following portions of the patient's history were reviewed and updated as appropriate:  She  has a past medical history of Asthma and Fibroids (08/04/2020).   She  has a past surgical history that includes No past surgeries.   Her family history includes Healthy in her father and mother; Hypertension in her maternal grandmother.   She  reports that she has been smoking cigars, e-cigarettes, and cigarettes. She has never used smokeless tobacco. She reports previous alcohol use of about 7.0 standard drinks of alcohol per week. She reports previous drug use. Frequency: 3.00 times per week. Drug: Marijuana.   Current Outpatient Medications on File Prior to Visit   Medication Sig Dispense Refill  . albuterol (PROVENTIL) (2.5 MG/3ML) 0.083% nebulizer solution Take 3 mLs (2.5 mg total) by nebulization every 6 (six) hours as needed for wheezing or shortness of breath. 75 mL 12  . albuterol (VENTOLIN HFA) 108 (90 Base) MCG/ACT inhaler Inhale 2-4 puffs into the lungs every 4 (four) hours as needed for wheezing or shortness of breath. 6.7 g 2  . oxyCODONE (ROXICODONE) 5 MG immediate release tablet Take 1 tablet (5 mg total) by mouth every 6 (six) hours as needed for moderate pain or severe pain (May take up to 2 tablets for severe pain). 10 tablet 0   No current facility-administered medications on file prior to visit.   She has No Known Allergies..  Review of Systems Pertinent items noted in HPI and remainder of comprehensive ROS otherwise negative.    Labs:  Admission on 08/04/2020, Discharged on 08/04/2020  Component Date Value Ref Range Status  . Lipase 08/04/2020 32  11 - 51 U/L Final   Performed at [facility], [address] [place], [place] [address]  . Sodium 08/04/2020 135  135 - 145 mmol/L Final  . Potassium 08/04/2020 3.6  3.5 - 5.1 mmol/L Final  . Chloride 08/04/2020 101  98 - 111 mmol/L Final  . CO2 08/04/2020 24  22 - 32 mmol/L Final  . Glucose, Bld 08/04/2020 112* 70 - 99 mg/dL Final   Glucose reference range applies only to samples taken after fasting for at least 8 hours.  Marland Kitchen  BUN 08/04/2020 13  6 - 20 mg/dL Final  . Creatinine, Ser 08/04/2020 0.59  0.44 - 1.00 mg/dL Final  . Calcium 08/04/2020 9.1  8.9 - 10.3 mg/dL Final  . Total Protein 08/04/2020 8.1  6.5 - 8.1 g/dL Final  . Albumin 08/04/2020 4.6  3.5 - 5.0 g/dL Final  . AST 08/04/2020 17  15 - 41 U/L Final  . ALT 08/04/2020 17  0 - 44 U/L Final  . Alkaline Phosphatase 08/04/2020 50  38 - 126 U/L Final  . Total Bilirubin 08/04/2020 1.2  0.3 - 1.2 mg/dL Final  . GFR, Estimated 08/04/2020 >60  >60 mL/min Final   Comment: (NOTE) Calculated using the CKD-EPI Creatinine  Equation (2021)   . Anion gap 08/04/2020 10  5 - 15 Final   Performed at [facility], [address] [place], [place] [address]  . WBC 08/04/2020 6.7  4.0 - 10.5 K/uL Final  . RBC 08/04/2020 4.65  3.87 - 5.11 MIL/uL Final  . Hemoglobin 08/04/2020 14.3  12.0 - 15.0 g/dL Final  . HCT 08/04/2020 39.0  36.0 - 46.0 % Final  . MCV 08/04/2020 83.9  80.0 - 100.0 fL Final  . MCH 08/04/2020 30.8  26.0 - 34.0 pg Final  . MCHC 08/04/2020 36.7* 30.0 - 36.0 g/dL Final  . RDW 08/04/2020 12.4  11.5 - 15.5 % Final  . Platelets 08/04/2020 311  150 - 400 K/uL Final  . nRBC 08/04/2020 0.0  0.0 - 0.2 % Final   Performed at [facility], [address] [place], [place] [address]  . Color, Urine 08/04/2020 YELLOW* YELLOW Final  . APPearance 08/04/2020 HAZY* CLEAR Final  . Specific Gravity, Urine 08/04/2020 1.031* 1.005 - 1.030 Final  . pH 08/04/2020 6.0  5.0 - 8.0 Final  . Glucose, UA 08/04/2020 NEGATIVE  NEGATIVE mg/dL Final  . Hgb urine dipstick 08/04/2020 NEGATIVE  NEGATIVE Final  . Bilirubin Urine 08/04/2020 NEGATIVE  NEGATIVE Final  . Ketones, ur 08/04/2020 5* NEGATIVE mg/dL Final  . Protein, ur 08/04/2020 NEGATIVE  NEGATIVE mg/dL Final  . Nitrite 08/04/2020 NEGATIVE  NEGATIVE Final  . Chalmers Guest 08/04/2020 NEGATIVE  NEGATIVE Final  . RBC / HPF 08/04/2020 0-5  0 - 5 RBC/hpf Final  . WBC, UA 08/04/2020 0-5  0 - 5 WBC/hpf Final  . Bacteria, UA 08/04/2020 NONE SEEN  NONE SEEN Final  . Squamous Epithelial / LPF 08/04/2020 0-5  0 - 5 Final  . Mucus 08/04/2020 PRESENT   Final   Performed at [facility], [address], [place], [place] [address]  . hCG, Beta Chain, Quant, S 08/04/2020 4,480* <5 mIU/mL Final   Comment:          GEST. AGE      CONC.  (mIU/mL)   <=1 WEEK        5 - 50     2 WEEKS       50 - 500     3 WEEKS       100 - 10,000     4 WEEKS     1,000 - 30,000     5 WEEKS     3,500 - 115,000   6-8 WEEKS     12,000 - 270,000    12 WEEKS     15,000 -  220,000        FEMALE AND NON-PREGNANT FEMALE:     LESS THAN 5 mIU/mL Performed at [facility], [address] [place],  [place] [address]   . Yeast Wet Prep HPF POC 08/04/2020 NONE SEEN  NONE SEEN Final  . Trich, Wet Prep 08/04/2020 NONE SEEN  NONE SEEN Final  . Clue Cells Wet Prep HPF POC 08/04/2020 PRESENT* NONE SEEN Final  . WBC, Wet Prep HPF POC 08/04/2020 FEW* NONE SEEN Final  . Sperm 08/04/2020 NONE SEEN   Final   Performed at [facility], [address] [place], [place] [address]  . Specimen source GC/Chlam 08/04/2020 ENDOCERVICAL   Final  . Chlamydia Tr 08/04/2020 NOT DETECTED  NOT DETECTED Final  . N gonorrhoeae 08/04/2020 NOT DETECTED  NOT DETECTED Final   Comment: (NOTE) This CT/NG assay has not been evaluated in patients with a history of  hysterectomy. Performed at [facility], [address], [place], [place] [address]   . SARS Coronavirus 2 by RT PCR 08/04/2020 NEGATIVE  NEGATIVE Final   Comment: (NOTE) SARS-CoV-2 target nucleic acids are NOT DETECTED.  The SARS-CoV-2 RNA is generally detectable in upper respiratory specimens during the acute phase of infection. The lowest concentration of SARS-CoV-2 viral copies this assay can detect is 138 copies/mL. A negative result does not preclude SARS-Cov-2 infection and should not be used as the sole basis for treatment or other patient management decisions. A negative result may occur with  improper specimen collection/handling, submission of specimen other than nasopharyngeal swab, presence of viral mutation(s) within the areas targeted by this assay, and inadequate number of viral copies(<138 copies/mL). A negative result must be combined with clinical observations, patient history, and epidemiological information. The expected result is Negative.  Fact Sheet for Patients:  BloggerCourse.com  Fact Sheet for Healthcare Providers:   SeriousBroker.it  This test is no                          t yet approved or cleared by the [place] FDA and  has been authorized for detection and/or diagnosis of SARS-CoV-2 by FDA under an Emergency Use Authorization (EUA). This EUA will remain  in effect (meaning this test can be used) for the duration of the COVID-19 declaration under Section 564(b)(1) of the Act, 21 U.S.C.section 360bbb-3(b)(1), unless the authorization is terminated  or revoked sooner.      . Influenza A by PCR 08/04/2020 NEGATIVE  NEGATIVE Final  . Influenza B by PCR 08/04/2020 NEGATIVE  NEGATIVE Final   Comment: (NOTE) The Xpert Xpress SARS-CoV-2/FLU/RSV plus assay is intended as an aid in the diagnosis of influenza from Nasopharyngeal swab specimens and should not be used as a sole basis for treatment. Nasal washings and aspirates are unacceptable for Xpert Xpress SARS-CoV-2/FLU/RSV testing.  Fact Sheet for Patients: BloggerCourse.com  Fact Sheet for Healthcare Providers: SeriousBroker.it  This test is not yet approved or cleared by the [place] FDA and has been authorized for detection and/or diagnosis of SARS-CoV-2 by FDA under an Emergency Use Authorization (EUA). This EUA will remain in effect (meaning this test can be used) for the duration of the COVID-19 declaration under Section 564(b)(1) of the Act, 21 U.S.C. section 360bbb-3(b)(1), unless the authorization is terminated or revoked.  Performed at [facility], [address], [place], [place] [address]   . ABO/RH(D) 08/04/2020 A NEG   Final  . Antibody Screen 08/04/2020 NEG   Final  . Sample Expiration 08/04/2020    Final                   Value:08/07/2020,2359  Performed at [facility], [address] [place], [place] [address]   . Unit Number 08/04/2020 [id number]   Final  . Blood Component Type 08/04/2020 RHIG   Final  .  Unit division 08/04/2020 00   Final  . Status of Unit 08/04/2020 ISSUED,FINAL   Final  . Transfusion Status 08/04/2020    Final                   Value:OK TO TRANSFUSE Performed at [facility], [address] [place], [place] [address]      Imaging:  RADIOLOGY   Imaging Results (Last 48 hours)[] Expand by Default  US OB LESS THAN 14 WEEKS WITH OB TRANSVAGINAL  Result Date: 08/04/2020 CLINICAL DATA:  Vaginal bleeding.  Beta HCG greater than 4,000. EXAM: OBSTETRIC <14 WK [place] AND TRANSVAGINAL OB US TECHNIQUE: Both transabdominal and transvaginal ultrasound examinations were performed for complete evaluation of the gestation as well as the maternal uterus, adnexal regions, and pelvic cul-de-sac. Transvaginal technique was performed to assess early pregnancy. COMPARISON:  None. FINDINGS: Intrauterine gestational sac: Not Seen Yolk sac:  Not Seen Embryo:  Not Seen Cardiac Activity: Not Seen Maternal uterus/adnexae: Endometrial complex is normal in thickness. No mass or fluid within the endometrial canal. No intrauterine gestational sac. Multiple fibroids, largest within the lateral fundal portion of the uterus measuring 4.2 cm. RIGHT ovary appears normal and there is no mass or free fluid seen within the RIGHT adnexal region. Echogenic mass is identified within the LEFT adnexal region, measuring 2.5 x 1.6 x 1.8 cm, highly suspicious for topic pregnancy, separate from a normal-appearing LEFT ovary. IMPRESSION: 1. Probable ectopic pregnancy within the LEFT adnexal region, separate from the normal-appearing LEFT ovary, measuring 2.5 cm. No free fluid/hemorrhage is identified in the LEFT adnexal region to suggest ectopic rupture. 2. Normal-appearing RIGHT ovary. No mass or free fluid in the RIGHT adnexal region. 3. No intrauterine gestational sac. 4. Multiple fibroids, largest measuring 4.2 cm. Critical Value/emergent results were called by telephone at the time of interpretation on 08/04/2020 at  5:02 pm to provider [facility] , who verbally acknowledged these results. Electronically Signed   By: Franki Cabot M.D.   On: 08/04/2020 17:02      US OB LESS THAN 14 WEEKS WITH OB TRANSVAGINAL  Result date: 07/21/2020 IMPRESSION: 1. A normal intrauterine gestational sac is not imaged. Heterogeneous fluid collection in the fundal endometrial canal could represent degenerating gestational sac or coexisting gestational sac and subchorionic hemorrhage. 2. Although no adnexal mass is identified, ectopic pregnancy has not been excluded given the quantitative beta HCG. 3. Numerous fibroids, largest 4.5 centimeters. 4. Recommend follow-up quantitative B-HCG levels and follow-up US in 14 or more days to assess viability. This recommendation follows SRU consensus guidelines: Diagnostic Criteria for Nonviable Pregnancy Early in the First Trimester. [place] Med 2013; 371:6967-89.   Objective:   Blood pressure 104/73, pulse 90, height 5\' 1"  (1.549 m), weight 151 lb 3.2 oz (68.6 kg), last menstrual period 06/05/2020. General appearance: alert and no distress  Lungs: clear to auscultation bilaterally Heart: regular rate and rhythm, S1, S2 normal, no murmur, click, rub or gallop Abdomen: soft, non-tender; bowel sounds normal; no masses,  no organomegaly Pelvic: deferred Neurologic: Grossly normal   Assessment:   Left ectopic pregnancy History of miscarriages Fibroid uterus  Plan:   1.  Left ectopic pregnancy -patient is status post methotrexate treatment 4 days ago.  Presents for follow-up lab work today.  Will  order repeat beta-hCG.  Patient is instructed to follow-up in the emergency room on day 7 for her next blood draw.  Discussed need for serial blood draws to ensure resolution of the ectopic pregnancy.  Patient is self-pay, does not have insurance.  May need to space blood draws in order to best accommodate the patient financially.  Based on day 7 labs, will assess if patient needs  second dosing of the methotrexate or can continue regimented follow-up.  Continue to follow strict ectopic precautions. 2.  History of miscarriages -unclear cause.  Patient does have a history of uterine fibroids, diagnosed on her ultrasound approximately 3 to 4 weeks ago.  Fibroids appear small.  May or may not be a cause of her infertility.  However patient also has had a history of pelvic infections, which could be a factor as well.  If patient desires can further evaluate after resolution of ectopic pregnancy.  3.  Fibroid uterus -reports normal cycles, fibroids are relatively small.  No intervention is necessary unless it is found to be a factor for her infertility or begins to become symptomatic otherwise.   Rubie Maid, MD [facility]

## 2020-08-08 NOTE — Progress Notes (Signed)
Pt present for ED follow up for ectopic pregnancy. Pt stated that she was doing well.

## 2020-08-09 LAB — HUMAN CHORIONIC GONADOTROPIN(HCG),B-SUBUNIT,QUANTITATIVE): HCG, Beta Chain, Quant, S: 3410 m[IU]/mL

## 2020-08-20 ENCOUNTER — Emergency Department: Payer: Self-pay

## 2020-08-20 ENCOUNTER — Encounter: Payer: Self-pay | Admitting: Obstetrics and Gynecology

## 2020-08-20 ENCOUNTER — Telehealth: Payer: Self-pay | Admitting: Obstetrics and Gynecology

## 2020-08-20 ENCOUNTER — Ambulatory Visit
Admission: EM | Admit: 2020-08-20 | Discharge: 2020-08-20 | Disposition: A | Discharge status: Home / Self Care | Payer: Self-pay | Attending: Emergency Medicine | Admitting: Emergency Medicine

## 2020-08-20 ENCOUNTER — Other Ambulatory Visit: Payer: Self-pay

## 2020-08-20 ENCOUNTER — Emergency Department: Payer: Self-pay | Admitting: Anesthesiology

## 2020-08-20 ENCOUNTER — Encounter: Payer: Self-pay | Admitting: Emergency Medicine

## 2020-08-20 ENCOUNTER — Encounter
Admission: EM | Disposition: A | Discharge status: Home / Self Care | Payer: Self-pay | Source: Home / Self Care | Attending: Emergency Medicine

## 2020-08-20 DIAGNOSIS — F17290 Nicotine dependence, other tobacco product, uncomplicated: Secondary | ICD-10-CM | POA: Insufficient documentation

## 2020-08-20 DIAGNOSIS — O26891 Other specified pregnancy related conditions, first trimester: Secondary | ICD-10-CM | POA: Insufficient documentation

## 2020-08-20 DIAGNOSIS — O99331 Smoking (tobacco) complicating pregnancy, first trimester: Secondary | ICD-10-CM | POA: Insufficient documentation

## 2020-08-20 DIAGNOSIS — Z3A00 Weeks of gestation of pregnancy not specified: Secondary | ICD-10-CM | POA: Insufficient documentation

## 2020-08-20 DIAGNOSIS — R1032 Left lower quadrant pain: Secondary | ICD-10-CM

## 2020-08-20 DIAGNOSIS — O3411 Maternal care for benign tumor of corpus uteri, first trimester: Secondary | ICD-10-CM | POA: Insufficient documentation

## 2020-08-20 DIAGNOSIS — N838 Other noninflammatory disorders of ovary, fallopian tube and broad ligament: Secondary | ICD-10-CM | POA: Insufficient documentation

## 2020-08-20 DIAGNOSIS — O00102 Left tubal pregnancy without intrauterine pregnancy: Secondary | ICD-10-CM | POA: Insufficient documentation

## 2020-08-20 DIAGNOSIS — Z79899 Other long term (current) drug therapy: Secondary | ICD-10-CM | POA: Insufficient documentation

## 2020-08-20 DIAGNOSIS — F17210 Nicotine dependence, cigarettes, uncomplicated: Secondary | ICD-10-CM | POA: Insufficient documentation

## 2020-08-20 DIAGNOSIS — D250 Submucous leiomyoma of uterus: Secondary | ICD-10-CM | POA: Insufficient documentation

## 2020-08-20 DIAGNOSIS — K661 Hemoperitoneum: Secondary | ICD-10-CM | POA: Insufficient documentation

## 2020-08-20 DIAGNOSIS — Z6791 Unspecified blood type, Rh negative: Secondary | ICD-10-CM

## 2020-08-20 HISTORY — PX: DIAGNOSTIC LAPAROSCOPY WITH REMOVAL OF ECTOPIC PREGNANCY: SHX6449

## 2020-08-20 LAB — CBC
HCT: 35.1 % — ABNORMAL LOW (ref 36.0–46.0)
Hemoglobin: 12.9 g/dL (ref 12.0–15.0)
MCH: 30.4 pg (ref 26.0–34.0)
MCHC: 36.8 g/dL — ABNORMAL HIGH (ref 30.0–36.0)
MCV: 82.8 fL (ref 80.0–100.0)
Platelets: 298 10*3/uL (ref 150–400)
RBC: 4.24 MIL/uL (ref 3.87–5.11)
RDW: 12.5 % (ref 11.5–15.5)
WBC: 5.6 10*3/uL (ref 4.0–10.5)
nRBC: 0 % (ref 0.0–0.2)

## 2020-08-20 LAB — COMPREHENSIVE METABOLIC PANEL
ALT: 15 U/L (ref 0–44)
AST: 17 U/L (ref 15–41)
Albumin: 4.3 g/dL (ref 3.5–5.0)
Alkaline Phosphatase: 42 U/L (ref 38–126)
Anion gap: 13 (ref 5–15)
BUN: 13 mg/dL (ref 6–20)
CO2: 22 mmol/L (ref 22–32)
Calcium: 9.3 mg/dL (ref 8.9–10.3)
Chloride: 103 mmol/L (ref 98–111)
Creatinine, Ser: 0.65 mg/dL (ref 0.44–1.00)
GFR, Estimated: >60 mL/min (ref >60)
Glucose, Bld: 98 mg/dL (ref 70–99)
Potassium: 3.6 mmol/L (ref 3.5–5.1)
Sodium: 138 mmol/L (ref 135–145)
Total Bilirubin: 1.6 mg/dL — ABNORMAL HIGH (ref 0.3–1.2)
Total Protein: 7.6 g/dL (ref 6.5–8.1)

## 2020-08-20 LAB — HCG, QUANTITATIVE, PREGNANCY: hCG, Beta Chain, Quant, S: 1209 m[IU]/mL — ABNORMAL HIGH (ref <5)

## 2020-08-20 LAB — LIPASE, BLOOD: Lipase: 29 U/L (ref 11–51)

## 2020-08-20 SURGERY — LAPAROSCOPY, WITH ECTOPIC PREGNANCY SURGICAL TREATMENT
Anesthesia: General | Laterality: Left

## 2020-08-20 MED ORDER — FENTANYL CITRATE (PF) 100 MCG/2ML IJ SOLN
INTRAMUSCULAR | Status: AC
  Filled 2020-08-20: qty 2

## 2020-08-20 MED ORDER — OXYCODONE HCL 5 MG/5ML PO SOLN: 5.0000 mg | Freq: Once | ORAL | Status: AC | PRN

## 2020-08-20 MED ORDER — MORPHINE SULFATE (PF) 2 MG/ML IV SOLN
2.0000 mg | Freq: Once | INTRAVENOUS | Status: AC
Start: 2020-08-20 — End: 2020-08-20
  Administered 2020-08-20: 2 mg via INTRAVENOUS
  Filled 2020-08-20: qty 1

## 2020-08-20 MED ORDER — ONDANSETRON 4 MG PO TBDP
ORAL_TABLET | ORAL | Status: AC
  Administered 2020-08-20: 4 mg via ORAL
  Filled 2020-08-20: qty 1

## 2020-08-20 MED ORDER — MIDAZOLAM HCL 2 MG/2ML IJ SOLN
INTRAMUSCULAR | Status: AC
  Filled 2020-08-20: qty 2

## 2020-08-20 MED ORDER — ROCURONIUM BROMIDE 100 MG/10ML IV SOLN
INTRAVENOUS | Status: DC | PRN
  Administered 2020-08-20: 5 mg via INTRAVENOUS
  Administered 2020-08-20 (×3): 10 mg via INTRAVENOUS
  Administered 2020-08-20: 45 mg via INTRAVENOUS

## 2020-08-20 MED ORDER — PROPOFOL 10 MG/ML IV BOLUS
INTRAVENOUS | Status: AC
  Filled 2020-08-20: qty 20

## 2020-08-20 MED ORDER — BUPIVACAINE HCL (PF) 0.5 % IJ SOLN
INTRAMUSCULAR | Status: AC
  Filled 2020-08-20: qty 30

## 2020-08-20 MED ORDER — SUGAMMADEX SODIUM 200 MG/2ML IV SOLN
INTRAVENOUS | Status: DC | PRN
  Administered 2020-08-20: 140 mg via INTRAVENOUS

## 2020-08-20 MED ORDER — ONDANSETRON HCL 4 MG/2ML IJ SOLN
4.0000 mg | Freq: Once | INTRAMUSCULAR | Status: AC
  Administered 2020-08-20: 4 mg via INTRAVENOUS
  Filled 2020-08-20: qty 2

## 2020-08-20 MED ORDER — KETOROLAC TROMETHAMINE 30 MG/ML IJ SOLN
INTRAMUSCULAR | Status: AC
  Filled 2020-08-20: qty 1

## 2020-08-20 MED ORDER — OXYCODONE HCL 5 MG PO TABS
ORAL_TABLET | ORAL | Status: AC
  Administered 2020-08-20: 5 mg via ORAL
  Filled 2020-08-20: qty 1

## 2020-08-20 MED ORDER — ALBUMIN HUMAN 5 % IV SOLN
INTRAVENOUS | Status: AC
  Filled 2020-08-20: qty 500

## 2020-08-20 MED ORDER — FENTANYL CITRATE (PF) 100 MCG/2ML IJ SOLN: 25.0000 ug | INTRAMUSCULAR | Status: DC | PRN

## 2020-08-20 MED ORDER — OXYCODONE HCL 5 MG PO TABS: 5.0000 mg | ORAL_TABLET | Freq: Four times a day (QID) | ORAL | 0 refills | Status: AC | PRN

## 2020-08-20 MED ORDER — OXYCODONE HCL 5 MG PO TABS: 5.0000 mg | ORAL_TABLET | Freq: Four times a day (QID) | ORAL | 0 refills | Status: DC | PRN

## 2020-08-20 MED ORDER — PHENYLEPHRINE HCL (PRESSORS) 10 MG/ML IV SOLN
INTRAVENOUS | Status: DC | PRN
  Administered 2020-08-20 (×2): 100 ug via INTRAVENOUS

## 2020-08-20 MED ORDER — IBUPROFEN 800 MG PO TABS: 800.0000 mg | ORAL_TABLET | Freq: Three times a day (TID) | ORAL | 1 refills | Status: DC | PRN

## 2020-08-20 MED ORDER — DEXMEDETOMIDINE (PRECEDEX) IN NS 20 MCG/5ML (4 MCG/ML) IV SYRINGE
PREFILLED_SYRINGE | INTRAVENOUS | Status: DC | PRN
  Administered 2020-08-20 (×2): 8 ug via INTRAVENOUS

## 2020-08-20 MED ORDER — FENTANYL CITRATE (PF) 100 MCG/2ML IJ SOLN
INTRAMUSCULAR | Status: DC | PRN
  Administered 2020-08-20 (×4): 50 ug via INTRAVENOUS

## 2020-08-20 MED ORDER — DEXAMETHASONE SODIUM PHOSPHATE 10 MG/ML IJ SOLN
INTRAMUSCULAR | Status: AC
  Filled 2020-08-20: qty 1

## 2020-08-20 MED ORDER — DOCUSATE SODIUM 100 MG PO CAPS: 100.0000 mg | ORAL_CAPSULE | Freq: Two times a day (BID) | ORAL | 2 refills | Status: DC | PRN

## 2020-08-20 MED ORDER — DEXAMETHASONE SODIUM PHOSPHATE 10 MG/ML IJ SOLN
INTRAMUSCULAR | Status: DC | PRN
  Administered 2020-08-20: 10 mg via INTRAVENOUS

## 2020-08-20 MED ORDER — KETOROLAC TROMETHAMINE 30 MG/ML IJ SOLN
INTRAMUSCULAR | Status: AC
  Administered 2020-08-20: 15 mg
  Filled 2020-08-20: qty 1

## 2020-08-20 MED ORDER — ONDANSETRON HCL 4 MG/2ML IJ SOLN
INTRAMUSCULAR | Status: DC | PRN
  Administered 2020-08-20: 4 mg via INTRAVENOUS

## 2020-08-20 MED ORDER — PROPOFOL 10 MG/ML IV BOLUS
INTRAVENOUS | Status: DC | PRN
  Administered 2020-08-20: 160 mg via INTRAVENOUS

## 2020-08-20 MED ORDER — DEXMEDETOMIDINE (PRECEDEX) IN NS 20 MCG/5ML (4 MCG/ML) IV SYRINGE
PREFILLED_SYRINGE | INTRAVENOUS | Status: AC
  Filled 2020-08-20: qty 5

## 2020-08-20 MED ORDER — SODIUM CHLORIDE 0.9 % IV SOLN
INTRAVENOUS | Status: DC | PRN
  Administered 2020-08-20: 50 ug/min via INTRAVENOUS

## 2020-08-20 MED ORDER — ALBUMIN HUMAN 5 % IV SOLN: INTRAVENOUS | Status: DC | PRN

## 2020-08-20 MED ORDER — ACETAMINOPHEN 500 MG PO TABS: 1000.0000 mg | ORAL_TABLET | Freq: Four times a day (QID) | ORAL | 1 refills | Status: DC | PRN

## 2020-08-20 MED ORDER — MORPHINE SULFATE (PF) 4 MG/ML IV SOLN
4.0000 mg | Freq: Once | INTRAVENOUS | Status: AC
Start: 2020-08-20 — End: 2020-08-20
  Administered 2020-08-20: 4 mg via INTRAVENOUS
  Filled 2020-08-20: qty 1

## 2020-08-20 MED ORDER — OXYCODONE HCL 5 MG PO TABS: 5.0000 mg | ORAL_TABLET | Freq: Once | ORAL | Status: AC | PRN

## 2020-08-20 MED ORDER — LIDOCAINE HCL (CARDIAC) PF 100 MG/5ML IV SOSY
PREFILLED_SYRINGE | INTRAVENOUS | Status: DC | PRN
  Administered 2020-08-20: 100 mg via INTRAVENOUS

## 2020-08-20 MED ORDER — SUCCINYLCHOLINE CHLORIDE 20 MG/ML IJ SOLN
INTRAMUSCULAR | Status: DC | PRN
  Administered 2020-08-20: 100 mg via INTRAVENOUS

## 2020-08-20 MED ORDER — MIDAZOLAM HCL 2 MG/2ML IJ SOLN
INTRAMUSCULAR | Status: DC | PRN
  Administered 2020-08-20: 2 mg via INTRAVENOUS

## 2020-08-20 MED ORDER — BUPIVACAINE HCL 0.5 % IJ SOLN
INTRAMUSCULAR | Status: DC | PRN
  Administered 2020-08-20: 20 mL

## 2020-08-20 MED ORDER — SIMETHICONE 80 MG PO CHEW: 80.0000 mg | CHEWABLE_TABLET | Freq: Four times a day (QID) | ORAL | 1 refills | Status: DC | PRN

## 2020-08-20 MED ORDER — ONDANSETRON 4 MG PO TBDP: 4.0000 mg | ORAL_TABLET | Freq: Once | ORAL | Status: AC

## 2020-08-20 MED ORDER — ONDANSETRON HCL 4 MG/2ML IJ SOLN
INTRAMUSCULAR | Status: AC
  Filled 2020-08-20: qty 2

## 2020-08-20 MED ORDER — SODIUM CHLORIDE 0.9 % IV SOLN: INTRAVENOUS | Status: DC | PRN

## 2020-08-20 MED ORDER — SODIUM CHLORIDE 0.9 % IV BOLUS
1000.0000 mL | Freq: Once | INTRAVENOUS | Status: AC
  Administered 2020-08-20: 1000 mL via INTRAVENOUS

## 2020-08-20 MED ORDER — KETOROLAC TROMETHAMINE 15 MG/ML IJ SOLN
15.0000 mg | Freq: Once | INTRAMUSCULAR | Status: DC
  Filled 2020-08-20: qty 1

## 2020-08-20 SURGICAL SUPPLY — 44 items
ADH SKN CLS APL DERMABOND .7 (GAUZE/BANDAGES/DRESSINGS) ×1
APL PRP STRL LF DISP 70% ISPRP (MISCELLANEOUS) ×1
BAG SPEC RTRVL LRG 6X4 10 (ENDOMECHANICALS) ×1
BLADE SURG SZ11 CARB STEEL (BLADE) ×2 IMPLANT
CANISTER SUCT 1200ML W/VALVE (MISCELLANEOUS) ×2 IMPLANT
CATH ROBINSON RED A/P 16FR (CATHETERS) ×2 IMPLANT
CHLORAPREP W/TINT 26 (MISCELLANEOUS) ×2 IMPLANT
CORD MONOPOLAR M/FML 12FT (MISCELLANEOUS) IMPLANT
COVER WAND RF STERILE (DRAPES) IMPLANT
DEFOGGER SCOPE WARMER CLEARIFY (MISCELLANEOUS) ×2 IMPLANT
DERMABOND ADVANCED (GAUZE/BANDAGES/DRESSINGS) ×1
DERMABOND ADVANCED .7 DNX12 (GAUZE/BANDAGES/DRESSINGS) ×1 IMPLANT
GAUZE 4X4 16PLY RFD (DISPOSABLE) IMPLANT
GLOVE SURG ENC MOIS LTX SZ6.5 (GLOVE) ×2 IMPLANT
GLOVE SURG ENC MOIS LTX SZ8 (GLOVE) ×2 IMPLANT
GLOVE SURG UNDER LTX SZ7 (GLOVE) ×2 IMPLANT
GOWN STRL REUS W/ TWL LRG LVL3 (GOWN DISPOSABLE) ×2 IMPLANT
GOWN STRL REUS W/TWL LRG LVL3 (GOWN DISPOSABLE) ×4
GOWN STRL REUS W/TWL XL LVL4 (GOWN DISPOSABLE) ×2 IMPLANT
GRASPER SUT TROCAR 14GX15 (MISCELLANEOUS) ×2 IMPLANT
IRRIGATION STRYKERFLOW (MISCELLANEOUS) ×1 IMPLANT
IRRIGATOR STRYKERFLOW (MISCELLANEOUS) ×2
IV LACTATED RINGERS 1000ML (IV SOLUTION) ×2 IMPLANT
KIT PINK PAD W/HEAD ARE REST (MISCELLANEOUS) ×2
KIT PINK PAD W/HEAD ARM REST (MISCELLANEOUS) ×1 IMPLANT
KIT TURNOVER CYSTO (KITS) ×2 IMPLANT
MANIFOLD NEPTUNE II (INSTRUMENTS) ×2 IMPLANT
NS IRRIG 500ML POUR BTL (IV SOLUTION) ×2 IMPLANT
PACK GYN LAPAROSCOPIC (MISCELLANEOUS) ×2 IMPLANT
PAD OB MATERNITY 4.3X12.25 (PERSONAL CARE ITEMS) ×2 IMPLANT
PAD PREP 24X41 OB/GYN DISP (PERSONAL CARE ITEMS) ×2 IMPLANT
POUCH ENDO CATCH 10MM SPEC (MISCELLANEOUS) IMPLANT
POUCH SPECIMEN RETRIEVAL 10MM (ENDOMECHANICALS) ×2 IMPLANT
SCISSORS METZENBAUM CVD 33 (INSTRUMENTS) IMPLANT
SET TUBE SMOKE EVAC HIGH FLOW (TUBING) ×2 IMPLANT
SHEARS HARMONIC ACE PLUS 36CM (ENDOMECHANICALS) ×2 IMPLANT
SLEEVE ENDOPATH XCEL 5M (ENDOMECHANICALS) ×2 IMPLANT
SUT VIC AB 3-0 SH 27 (SUTURE) ×2
SUT VIC AB 3-0 SH 27X BRD (SUTURE) ×1 IMPLANT
SUT VIC AB 4-0 FS2 27 (SUTURE) ×2 IMPLANT
SUT VICRYL 0 AB UR-6 (SUTURE) ×4 IMPLANT
TROCAR ENDO BLADELESS 11MM (ENDOMECHANICALS) ×2 IMPLANT
TROCAR XCEL NON-BLD 5MMX100MML (ENDOMECHANICALS) ×2 IMPLANT
TROCAR XCEL UNIV SLVE 11M 100M (ENDOMECHANICALS) IMPLANT

## 2020-08-20 NOTE — Transfer of Care (Signed)
Immediate Anesthesia Transfer of Care Note  Patient: Brenda Aguirre  Procedure(s) Performed: DIAGNOSTIC LAPAROSCOPY WITH REMOVAL OF ECTOPIC PREGNANCY; SALPINGECTOMY (Left )  Patient Location: PACU  Anesthesia Type:General  Level of Consciousness: awake, drowsy and patient cooperative  Airway & Oxygen Therapy: Patient Spontanous Breathing and Patient connected to face mask oxygen  Post-op Assessment: Report given to RN and Post -op Vital signs reviewed and stable  Post vital signs: Reviewed and stable  Last Vitals:  Vitals Value Taken Time  BP    Temp    Pulse 81 08/20/20 1920  Resp 25 08/20/20 1921  SpO2 100 % 08/20/20 1920  Vitals shown include unvalidated device data.  Last Pain:  Vitals:   08/20/20 1620  TempSrc:   PainSc: 8          Complications: No complications documented.

## 2020-08-20 NOTE — ED Notes (Signed)
See triage note. Pt states had ectopic pregnancy that was taking methotrexate this past week. States has had lower abdominal pain all week but pain became intense this morning and has been crying from pain. Sharp, stabbing lower abdomen 10/10 pain. Minimal vaginal bleeding; seen at [facility] 2d ago, had intravaginal ultrasound there and all looked WNL on ultrasound.  Has N/V, no diarrhea.   Pt was on oxycodone 5mg  PRN this week, ran out. She called OBGYN (Dr Marcelline Mates) for refill but had to come to ED because could not stand pain.  Roswell Miners, PA at bedside with pt.

## 2020-08-20 NOTE — ED Notes (Signed)
Dr Marcelline Mates at bedside explaining findings and plan of care with pt and pt mother.

## 2020-08-20 NOTE — Telephone Encounter (Signed)
New Message:  Pt was at the [facility] ER over the weekend bc she was in so much pain.  They would not prescribe her any pain meds and she is wanting to know if Dr Marcelline Mates can prescribe pain meds so that she is able to go to work.

## 2020-08-20 NOTE — ED Notes (Signed)
Partner at bedside. Pt verbalizes slight reduction in pain with IV morphine but still in severe pain. Pain mostly located LLQ of abdomen.

## 2020-08-20 NOTE — ED Notes (Signed)
Pt resting in bed. Pt states OB ultrasound was performed. Explained waiting for results to know about plan of care.

## 2020-08-20 NOTE — Anesthesia Preprocedure Evaluation (Signed)
Anesthesia Evaluation  Patient identified by MRN, date of birth, ID band Patient awake    Reviewed: Allergy & Precautions, H&P , NPO status , Patient's Chart, lab work & pertinent test results  History of Anesthesia Complications Negative for: history of anesthetic complications  Airway Mallampati: II  TM Distance: >3 FB Neck ROM: full    Dental  (+) Chipped, Poor Dentition   Pulmonary neg pulmonary ROS, neg shortness of breath, asthma , Current Smoker and Patient abstained from smoking.,    Pulmonary exam normal        Cardiovascular Exercise Tolerance: Good (-) angina(-) Past MI and (-) DOE negative cardio ROS Normal cardiovascular exam     Neuro/Psych negative neurological ROS  negative psych ROS   GI/Hepatic negative GI ROS, Neg liver ROS, neg GERD  ,  Endo/Other  negative endocrine ROS  Renal/GU      Musculoskeletal   Abdominal   Peds  Hematology negative hematology ROS (+)   Anesthesia Other Findings Past Medical History: No date: Asthma 08/04/2020: Fibroids  Past Surgical History: No date: NO PAST SURGERIES  BMI    Body Mass Index: 28.34 kg/m      Reproductive/Obstetrics                             Anesthesia Physical Anesthesia Plan  ASA: III  Anesthesia Plan: General ETT   Post-op Pain Management:    Induction: Intravenous  PONV Risk Score and Plan: Ondansetron, Dexamethasone, Midazolam and Treatment may vary due to age or medical condition  Airway Management Planned: Oral ETT  Additional Equipment:   Intra-op Plan:   Post-operative Plan: Extubation in OR  Informed Consent: I have reviewed the patients History and Physical, chart, labs and discussed the procedure including the risks, benefits and alternatives for the proposed anesthesia with the patient or authorized representative who has indicated his/her understanding and acceptance.     Dental  Advisory Given  Plan Discussed with: Anesthesiologist, CRNA and Surgeon  Anesthesia Plan Comments: (Patient consented for risks of anesthesia including but not limited to:  - adverse reactions to medications - damage to eyes, teeth, lips or other oral mucosa - nerve damage due to positioning  - sore throat or hoarseness - Damage to heart, brain, nerves, lungs, other parts of body or loss of life  Patient voiced understanding.)        Anesthesia Quick Evaluation

## 2020-08-20 NOTE — H&P (Addendum)
Reason for Consult: left ectopic pregnancy ruptured Referring Provider:  Bonita Quin, PA (Emergency Medicine)    Brenda Aguirre is an 25 y.o. G18P0030 female with a left ectopic pregnancy s/p Methotrexate therapy who was diagnosed ~ 2 weeks ago. She presented to the Emergency Room today with complaints of worsening left-sided pelvic pain, to the point that narcotic pain medications do not help. She endorses nausea, chills alternating with feeling hot and flushed (but no documented fevers).  Has been experiencing light vaginal bleeding over the past few days. Initial BHCG quant level was 4480 on 08/04/2020.  She did receive Rhogam during her ER visit on 08/04/2020 visit as she was experiencing some mild vaginal bleeding at that time.   Of note, patient was seen in the ER at [facility] 2 days ago.  At that time she was complaining of LLQ pain, with nausea and vomiting. Her BHCG at that time was 1533, down from 3410 on 5/4. Ultrasound at that time noted dilated:   "Dilated fallopian tube with an ill-defined 2.7 x 3.6 cm heterogeneous echogenic mass in the left adnexa, compatible with history of ectopic pregnancy. Small volume complex fluid in the pelvis. Fibroid uterus. Recommend clinical correlation, serial quantitative beta HCGs, ectopic precautions, and followup ultrasound as clinically appropriate."  Patient at that time was deemed stable for discharge and given pain medications (Ibuprofen, Oxycodone) and Zofran.    Pertinent Gynecological History: Menses: flow is moderate, regular every month without intermenstrual spotting, usually lasting less than 6 days and with minimal cramping Contraception: none Blood transfusions: none Sexually transmitted diseases: past history: Chlamydia, Gonorrhea, Syphyillis.  Previous GYN Procedures: No  Last pap: normal Date: cannot recall   Menstrual History: Menarche age: 80 Patient's last menstrual period was 06/05/2020 (exact date).    Past Medical History:   Diagnosis Date  . Asthma   . Fibroids 08/04/2020    Past Surgical History:  Procedure Laterality Date  . NO PAST SURGERIES      Family History  Problem Relation Age of Onset  . Hypertension Maternal Grandmother   . Healthy Mother   . Healthy Father     Social History:  reports that she has been smoking cigars, e-cigarettes, and cigarettes. She has never used smokeless tobacco. She reports previous alcohol use of about 7.0 standard drinks of alcohol per week. She reports previous drug use. Frequency: 3.00 times per week. Drug: Marijuana.  Allergies: No Known Allergies  Medications:  No current facility-administered medications on file prior to encounter.   Current Outpatient Medications on File Prior to Encounter  Medication Sig Dispense Refill  . albuterol (PROVENTIL) (2.5 MG/3ML) 0.083% nebulizer solution Take 3 mLs (2.5 mg total) by nebulization every 6 (six) hours as needed for wheezing or shortness of breath. 75 mL 12  . albuterol (VENTOLIN HFA) 108 (90 Base) MCG/ACT inhaler Inhale 2-4 puffs into the lungs every 4 (four) hours as needed for wheezing or shortness of breath. 6.7 g 2  . oxyCODONE (ROXICODONE) 5 MG immediate release tablet Take 1 tablet (5 mg total) by mouth every 6 (six) hours as needed for moderate pain or severe pain (May take up to 2 tablets for severe pain). 10 tablet 0     Review of Systems  Constitutional: Positive for chills and diaphoresis. Negative for fever.  HENT: Negative.   Eyes: Negative.   Respiratory: Negative.   Cardiovascular: Negative.   Gastrointestinal: Positive for diarrhea and nausea. Negative for abdominal distention and blood in stool.  Endocrine: Negative.  Genitourinary: Positive for pelvic pain and vaginal bleeding. Negative for flank pain and frequency.  Musculoskeletal: Negative.   Allergic/Immunologic: Negative.   Neurological: Negative.   Hematological: Negative.   Psychiatric/Behavioral: Negative.     Blood pressure  111/81, pulse 88, temperature 98.4 F (36.9 C), temperature source Oral, resp. rate 20, height 5\' 1"  (1.549 m), weight 68 kg, last menstrual period 06/05/2020, SpO2 99 %, unknown if currently breastfeeding.   Physical Exam Vitals reviewed.  Constitutional:      Comments: Drowsy, in mild distress  HENT:     Head: Normocephalic and atraumatic.     Mouth/Throat:     Mouth: Mucous membranes are moist.     Pharynx: Oropharynx is clear.  Eyes:     General: No scleral icterus.    Extraocular Movements: Extraocular movements intact.     Pupils: Pupils are equal, round, and reactive to light.  Cardiovascular:     Rate and Rhythm: Normal rate and regular rhythm.     Heart sounds: Normal heart sounds. No murmur heard.   Pulmonary:     Effort: Pulmonary effort is normal. No respiratory distress.     Breath sounds: Normal breath sounds. No wheezing.  Abdominal:     General: Abdomen is flat. Bowel sounds are normal. There is no distension.     Palpations: Abdomen is soft. There is no mass.     Tenderness: There is abdominal tenderness in the right lower quadrant and left lower quadrant. There is no guarding or rebound.     Hernia: No hernia is present.  Genitourinary:    Comments: Deferred Skin:    General: Skin is warm and dry.  Neurological:     General: No focal deficit present.     Results for orders placed or performed during the hospital encounter of 08/20/20 (from the past 48 hour(s))  Lipase, blood     Status: None   Collection Time: 08/20/20 12:47 PM  Result Value Ref Range   Lipase 29 11 - 51 U/L    Comment: Performed at [facility], [address] [place], [place] [address]  Comprehensive metabolic panel     Status: Abnormal   Collection Time: 08/20/20 12:47 PM  Result Value Ref Range   Sodium 138 135 - 145 mmol/L   Potassium 3.6 3.5 - 5.1 mmol/L   Chloride 103 98 - 111 mmol/L   CO2 22 22 - 32 mmol/L   Glucose, Bld 98 70 - 99 mg/dL    Comment: Glucose  reference range applies only to samples taken after fasting for at least 8 hours.   BUN 13 6 - 20 mg/dL   Creatinine, Ser 0.65 0.44 - 1.00 mg/dL   Calcium 9.3 8.9 - 10.3 mg/dL   Total Protein 7.6 6.5 - 8.1 g/dL   Albumin 4.3 3.5 - 5.0 g/dL   AST 17 15 - 41 U/L   ALT 15 0 - 44 U/L   Alkaline Phosphatase 42 38 - 126 U/L   Total Bilirubin 1.6 (H) 0.3 - 1.2 mg/dL   GFR, Estimated >60 >60 mL/min    Comment: (NOTE) Calculated using the CKD-EPI Creatinine Equation (2021)    Anion gap 13 5 - 15    Comment: Performed at [facility], [address] [place], [place] [address]  CBC     Status: Abnormal   Collection Time: 08/20/20 12:47 PM  Result Value Ref Range   WBC 5.6 4.0 - 10.5 K/uL   RBC 4.24 3.87 -  5.11 MIL/uL   Hemoglobin 12.9 12.0 - 15.0 g/dL   HCT 35.1 (L) 36.0 - 46.0 %   MCV 82.8 80.0 - 100.0 fL   MCH 30.4 26.0 - 34.0 pg   MCHC 36.8 (H) 30.0 - 36.0 g/dL   RDW 12.5 11.5 - 15.5 %   Platelets 298 150 - 400 K/uL   nRBC 0.0 0.0 - 0.2 %    Comment: Performed at [facility], [address] [place], [place] [address]  hCG, quantitative, pregnancy     Status: Abnormal   Collection Time: 08/20/20 12:47 PM  Result Value Ref Range   hCG, Beta Chain, Quant, S 1,209 (H) <5 mIU/mL    Comment:          GEST. AGE      CONC.  (mIU/mL)   <=1 WEEK        5 - 50     2 WEEKS       50 - 500     3 WEEKS       100 - 10,000     4 WEEKS     1,000 - 30,000     5 WEEKS     3,500 - 115,000   6-8 WEEKS     12,000 - 270,000    12 WEEKS     15,000 - 220,000        FEMALE AND NON-PREGNANT FEMALE:     LESS THAN 5 mIU/mL Performed at [facility], [address] [place], [place] [address]     US OB LESS THAN 14 WEEKS WITH [place] TRANSVAGINAL  Result Date: 08/20/2020 CLINICAL DATA:  Worsening left lower quadrant pain. Ectopic pregnancy treated with methotrexate. EXAM: OBSTETRIC <14 WK [place] AND TRANSVAGINAL OB US TECHNIQUE: Both transabdominal and transvaginal ultrasound  examinations were performed for complete evaluation of the gestation as well as the maternal uterus, adnexal regions, and pelvic cul-de-sac. Transvaginal technique was performed to assess early pregnancy. COMPARISON:  08/04/2020 FINDINGS: Intrauterine gestational sac: None Maternal uterus/adnexae: Several uterine fibroids are again seen, largest in the right fundal region measuring 4.0 cm in maximum diameter. Right ovary is normal appearance. A complex cystic and solid mass is again seen in the left adnexa adjacent to the ovary. This measures 3.0 x 2.7 x 2.6 cm, which has increased in size compared to 2.5 x 1.6 x 1.8 cm on prior study. There is also a moderate amount of complex free fluid in the pelvis and extending into the abdomen, which is new since previous study. IMPRESSION: Increased size of 3.0 cm complex left adnexal mass and new moderate complex free fluid, consistent with ruptured left ectopic pregnancy with hemoperitoneum. Critical Value/emergent results were called by telephone at the time of interpretation on 08/20/2020 at 3:27 pm to provider [facility] PA, who verbally acknowledged these results. Electronically Signed   By: Marlaine Hind M.D.   On: 08/20/2020 15:29    Assessment/Plan: 25 y.o. G4P0030 with left ectopic pregnancy.  BHCG levels trending downward after Methotrexate therapy received on 08/04/2020. On exam, she had stable vital signs, however significant abdominal pain and tenderness noted.  Ultrasound consistent with findings for ectopic pregnancy.  Patient was counseled regarding need for urgent surgical intervention. Risks of surgery including bleeding which may require transfusion or reoperation, infection, injury to bowel or other surrounding organs, need for additional procedures including (laparoscopy or) laparotomy were explained to patient and written informed consent was obtained.  Discussed plan of left salpingectomy unless tube  was actually not fully ruptured, and  salpingostomy was possible. Patient has been NPO since ~ 0900 and she will remain NPO for procedure. Anesthesia and OR aware.  Preoperative SCDs ordered on call to the OR.  To OR when ready.  Patient's mother signed consent in lieu of patient as she appeared drowsy and not fully coherent to sign.   Rh negative status, did receive Rhogam 2 weeks ago.  Not indicated today.    Rubie Maid, MD [facility] 08/20/2020

## 2020-08-20 NOTE — ED Triage Notes (Addendum)
Pt via POV from home. Pt c/o lower abd pain on and off for about a week but today it has gotten worse. Pt had an ectopic pregnant and was given the medication at her doctor's office to terminate the pregnancy about a week ago. Pt also NV but no diarrhea. Pt states, "I hurt all over, I feel hot and cold at the same time." Pt is A&Ox4 and NAD.

## 2020-08-20 NOTE — ED Notes (Signed)
Consent signed for surgery.

## 2020-08-20 NOTE — ED Provider Notes (Signed)
[facility] Emergency Department Provider Note  ____________________________________________   Event Date/Time   First MD Initiated Contact with Patient 08/20/20 1253     (approximate)  I have reviewed the triage vital signs and the nursing notes.   HISTORY  Chief Complaint Abdominal Pain  HPI Brenda Aguirre is a 25 y.o. female who presents to the emergency department today for evaluation of severe abdominal pain, across the lower abdomen, worse on the left.  Patient reports that she was recently evaluated and treated with methotrexate for a left ectopic pregnancy.  She states that she was seen 2 days ago in the [facility] emergency department due to persistence of her pain.  She states that each time she comes to the emergency department, her pain is improved with the oxycodone, however she states that her pain is so severe she is unable to function without the oxycodone on board.  She reports that she continues to have some mild vaginal bleeding, reports that she is also having nausea and vomiting.  She denies any fevers, syncope, diarrhea or other complaints.         Past Medical History:  Diagnosis Date  . Asthma   . Fibroids 08/04/2020    Patient Active Problem List   Diagnosis Date Noted  . Ectopic pregnancy without intrauterine pregnancy 08/04/2020  . Vaginal bleeding affecting early pregnancy 08/04/2020  . Uterine fibroid 08/04/2020  . Rh negative state in antepartum period, first trimester 08/04/2020  . Cigar smoker 2/day + vapes 02/21/2020  . Marijuana use 02/21/2020  . Syphilis 02/07/2019  . Overweight 01/06/2018    Past Surgical History:  Procedure Laterality Date  . NO PAST SURGERIES      Prior to Admission medications   Medication Sig Start Date End Date Taking? Authorizing Provider  acetaminophen (TYLENOL) 500 MG tablet Take 2 tablets (1,000 mg total) by mouth every 6 (six) hours as needed for mild pain. 08/20/20 08/20/21 Yes  Rubie Maid, MD  docusate sodium (COLACE) 100 MG capsule Take 1 capsule (100 mg total) by mouth 2 (two) times daily as needed. 08/20/20  Yes Rubie Maid, MD  ibuprofen (ADVIL) 800 MG tablet Take 1 tablet (800 mg total) by mouth every 8 (eight) hours as needed. 08/20/20  Yes Rubie Maid, MD  simethicone (GAS-X) 80 MG chewable tablet Chew 1 tablet (80 mg total) by mouth 4 (four) times daily as needed for flatulence (bloating). 08/20/20 08/20/21 Yes Rubie Maid, MD  albuterol (PROVENTIL) (2.5 MG/3ML) 0.083% nebulizer solution Take 3 mLs (2.5 mg total) by nebulization every 6 (six) hours as needed for wheezing or shortness of breath. 08/11/16   Johnn Hai, PA-C  albuterol (VENTOLIN HFA) 108 (90 Base) MCG/ACT inhaler Inhale 2-4 puffs into the lungs every 4 (four) hours as needed for wheezing or shortness of breath. 11/14/18   Hinda Kehr, MD  oxyCODONE (ROXICODONE) 5 MG immediate release tablet Take 1 tablet (5 mg total) by mouth every 6 (six) hours as needed for up to 5 days for moderate pain or severe pain (May take up to 2 tablets for severe pain). 08/20/20 08/25/20  Rubie Maid, MD    Allergies Patient has no known allergies.  Family History  Problem Relation Age of Onset  . Hypertension Maternal Grandmother   . Healthy Mother   . Healthy Father     Social History Social History   Tobacco Use  . Smoking status: Current Every Day Smoker    Types: Cigars, E-cigarettes, Cigarettes  .  Smokeless tobacco: Never Used  . Tobacco comment: Smoking x4 years  Vaping Use  . Vaping Use: Some days  . Start date: 09/29/2018  Substance Use Topics  . Alcohol use: Not Currently    Alcohol/week: 7.0 standard drinks    Types: 3 Glasses of wine, 4 Shots of liquor per week  . Drug use: Not Currently    Frequency: 3.0 times per week    Types: Marijuana    Review of Systems Constitutional: No fever/chills Eyes: No visual changes. ENT: No sore throat. Cardiovascular: Denies chest  pain. Respiratory: Denies shortness of breath. Gastrointestinal: + abdominal pain.  + nausea, + vomiting.  No diarrhea.  No constipation. Genitourinary: Negative for dysuria. Musculoskeletal: Negative for back pain. Skin: Negative for rash. Neurological: Negative for headaches, focal weakness or numbness.   ____________________________________________   PHYSICAL EXAM:  VITAL SIGNS: ED Triage Vitals [08/20/20 1235]  Enc Vitals Group     BP 127/76     Pulse Rate 90     Resp 20     Temp 98.4 F (36.9 C)     Temp Source Oral     SpO2 97 %     Weight 150 lb (68 kg)     Height 5\' 1"  (1.549 m)     Head Circumference      Peak Flow      Pain Score 10     Pain Loc      Pain Edu?      Excl. in [place]?    Constitutional: Alert and oriented. Well appearing and in no acute distress. Eyes: Conjunctivae are normal. PERRL. EOMI. Head: Atraumatic. Nose: No congestion/rhinnorhea. Mouth/Throat: Mucous membranes are moist.  Oropharynx non-erythematous. Neck: No stridor.   Cardiovascular: Normal rate, regular rhythm. Grossly normal heart sounds.  Good peripheral circulation. Respiratory: Normal respiratory effort.  No retractions. Lungs CTAB. Gastrointestinal: Significant tenderness to palpation of the lower abdomen, worse on the left lower quadrant with guarding present.  No abdominal bruits. No CVA tenderness. Musculoskeletal: No lower extremity tenderness nor edema.  No joint effusions. Neurologic:  Normal speech and language. No gross focal neurologic deficits are appreciated. No gait instability. Skin:  Skin is warm, dry and intact. No rash noted. Psychiatric: Mood and affect are normal. Speech and behavior are normal.  ____________________________________________   LABS (all labs ordered are listed, but only abnormal results are displayed)  Labs Reviewed  COMPREHENSIVE METABOLIC PANEL - Abnormal; Notable for the following components:      Result Value   Total Bilirubin 1.6 (*)     All other components within normal limits  CBC - Abnormal; Notable for the following components:   HCT 35.1 (*)    MCHC 36.8 (*)    All other components within normal limits  HCG, QUANTITATIVE, PREGNANCY - Abnormal; Notable for the following components:   hCG, Beta Chain, Quant, S 1,209 (*)    All other components within normal limits  LIPASE, BLOOD  URINALYSIS, COMPLETE (UACMP) WITH MICROSCOPIC  POC URINE PREG, ED  SURGICAL PATHOLOGY    ____________________________________________  RADIOLOGY   Official radiology report(s): US OB LESS THAN 14 WEEKS WITH [place] TRANSVAGINAL  Result Date: 08/20/2020 CLINICAL DATA:  Worsening left lower quadrant pain. Ectopic pregnancy treated with methotrexate. EXAM: OBSTETRIC <14 WK [place] AND TRANSVAGINAL OB US TECHNIQUE: Both transabdominal and transvaginal ultrasound examinations were performed for complete evaluation of the gestation as well as the maternal uterus, adnexal regions, and pelvic cul-de-sac. Transvaginal technique was performed to assess early  pregnancy. COMPARISON:  08/04/2020 FINDINGS: Intrauterine gestational sac: None Maternal uterus/adnexae: Several uterine fibroids are again seen, largest in the right fundal region measuring 4.0 cm in maximum diameter. Right ovary is normal appearance. A complex cystic and solid mass is again seen in the left adnexa adjacent to the ovary. This measures 3.0 x 2.7 x 2.6 cm, which has increased in size compared to 2.5 x 1.6 x 1.8 cm on prior study. There is also a moderate amount of complex free fluid in the pelvis and extending into the abdomen, which is new since previous study. IMPRESSION: Increased size of 3.0 cm complex left adnexal mass and new moderate complex free fluid, consistent with ruptured left ectopic pregnancy with hemoperitoneum. Critical Value/emergent results were called by telephone at the time of interpretation on 08/20/2020 at 3:27 pm to provider [facility] PA, who verbally acknowledged  these results. Electronically Signed   By: Marlaine Hind M.D.   On: 08/20/2020 15:29    ____________________________________________   INITIAL IMPRESSION / ASSESSMENT AND PLAN / ED COURSE  As part of my medical decision making, I reviewed the following data within the [place] notes reviewed and incorporated, Labs reviewed and Notes from prior ED visits        Patient is a 25 year old female who presents to the emergency department today for evaluation of severe abdominal pain, worse in the left lower quadrant.  She recently had ectopic treated with methotrexate a on 4/30, then had outpatient follow-up 4 days later on 5/4.  She was having downtrending hCG, was to have another in the ER but did not return.  She presented on 5/14 to the [facility] ER at which time she continued to have some downtrending of her hCG, and ultrasound there was concern for possible retained products of conception versus inflammation of the left fallopian tube.  She presents today for continued worsening pain, and has guarding present on exam.  CMP and CBC are unremarkable.  Her vitals are stable in triage.  Quantitative hCG was obtained and is 1200, still downtrending from 1600 at [facility] 2 days ago as well as down trended from her initial hCG 2 weeks ago of nearly 4000.  Case was discussed with Dr. Marcelline Mates, who first consulted on the patient 2 weeks ago.  Though her hCG is downtrending, given her severe pain, ultrasound was repeated.  I received a call from radiology reading the ultrasound who stated that her left adnexal mass was increasing in size and she now has a moderate hemoperitoneum with signs of rupture.  I immediately paged Dr. Marcelline Mates back and discussed the findings with her, and she agrees to see and evaluate the patient for admission and treatment.  The patient was notified of the findings, her vitals remained stable and her pain is currently controlled at this time.       ____________________________________________   FINAL CLINICAL IMPRESSION(S) / ED DIAGNOSES  Final diagnoses:  LLQ pain  Submucous leiomyoma of uterus  Rh negative state in antepartum period, first trimester  Left tubal pregnancy without intrauterine pregnancy     ED Discharge Orders         Ordered    oxyCODONE (ROXICODONE) 5 MG immediate release tablet  Every 6 hours PRN,   Status:  Discontinued        08/20/20 1918    ibuprofen (ADVIL) 800 MG tablet  Every 8 hours PRN        08/20/20 1918    acetaminophen (TYLENOL)  500 MG tablet  Every 6 hours PRN        08/20/20 1918    Discharge patient        08/20/20 1918    docusate sodium (COLACE) 100 MG capsule  2 times daily PRN        08/20/20 1938    simethicone (GAS-X) 80 MG chewable tablet  4 times daily PRN        08/20/20 1938    oxyCODONE (ROXICODONE) 5 MG immediate release tablet  Every 6 hours PRN,   Status:  Discontinued        08/20/20 1949    oxyCODONE (ROXICODONE) 5 MG immediate release tablet  Every 6 hours PRN        08/20/20 2008          *Please note:  Brenda Aguirre was evaluated in Emergency Department on 08/20/2020 for the symptoms described in the history of present illness. She was evaluated in the context of the global COVID-19 pandemic, which necessitated consideration that the patient might be at risk for infection with the SARS-CoV-2 virus that causes COVID-19. Institutional protocols and algorithms that pertain to the evaluation of patients at risk for COVID-19 are in a state of rapid change based on information released by regulatory bodies including the CDC and federal and state organizations. These policies and algorithms were followed during the patient's care in the ED.  Some ED evaluations and interventions may be delayed as a result of limited staffing during and the pandemic.*   Note:  This document was prepared using Dragon voice recognition software and may include unintentional dictation  errors.   Marlana Salvage, PA 08/20/20 2022    Carrie Mew, MD 08/22/20 [phone]

## 2020-08-20 NOTE — Op Note (Signed)
Procedure(s): DIAGNOSTIC LAPAROSCOPY WITH REMOVAL OF ECTOPIC PREGNANCY; SALPINGECTOMY Procedure Note  Brenda Aguirre female 25 y.o. 08/20/2020  Indications: The patient is a 25 y.o. G24P0030 female with left ectopic pregnancy s/p methotrexate therapy ~ 2 weeks ago, now with suspicions for ruptured ectopic. Rh negative, received Rhogam at Emergency Room visit 2 weeks ago for vaginal bleeding. Also with fibroid uterus.    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   Anesthesia: General endotracheal anesthesia  ASA Class:   Procedure Details: The patient was seen in the Holding Room. The risks, benefits, complications, treatment options, and expected outcomes were discussed with the patient.  The patient concurred with the proposed plan, giving informed consent.  The site of surgery properly noted/marked. The patient was taken to the Operating Room, identified as Brenda Aguirre and the procedure verified as Procedure(s) (LRB): DIAGNOSTIC LAPAROSCOPY WITH REMOVAL OF ECTOPIC PREGNANCY; SALPINGECTOMY (Left).   She was then placed under general anesthesia without difficulty. She was placed in the dorsal lithotomy position, and was prepped and draped in a sterile manner. A Time Out was held and the above information confirmed.  A straight catheterization was performed. A sterile speculum was inserted into the vagina and the cervix was grasped at the anterior lip using a single-toothed tenaculum.  The uterus was sounded to 7 cm, and a Hulka clamp was placed for uterine manipulation.  The speculum and tenaculum were then removed. After an adequate timeout was performed, attention was turned to the abdomen  where an umbilical incision was made with the scalpel.  The Optiview 11-mm trocar and sleeve were then advanced without difficulty with the laparoscope under direct visualization into the abdomen.  The abdomen was then insufflated with carbon dioxide gas and adequate pneumoperitoneum was obtained. A 5-mm left lower quadrant port and an 5-mm suprapubic port were then placed under direct visualization.  A survey of the patient's pelvis and abdomen revealed the findings as above. A suction irrigator was used to clear the pelvis of the hemoperitoneum (300 ml collected).  Attention was then turned to the left fallopian tube which was grasped and ligated from the underlying mesosalpinx and uterine attachment using the Harmonic instrument.  Good hemostasis was noted.  The specimen was placed in an EndoCatch bag and removed from the abdomen intact.  Attention was then turned to the right ovary where blunt dissection was used to lyse the posterior adhesions of the uterus and right ovary. Good hemostasis was again noted. The abdomen was desufflated, and all instruments were removed.  The fascial incision of the 11-mm site was reapproximated with a 0 Vicryl figure-of-eight stitch; and all skin incisions were closed with 4-0 Monocryl and Dermabond. The patient tolerated the procedure well.  Sponge, lap, and needle counts were correct times three.  The patient was then taken to the recovery room awake, extubated and in stable condition.   The patient will be discharged to home as per PACU criteria.  Routine postoperative instructions given.  She was prescribed Oxycodone, Ibuprofen and Colace.  She will follow up in the office in about 1-2 weeks for postoperative evaluation.  Findings: The uterus was sounded to 7 cm.  Moderate amount of hemoperitoneum estimated to be about 300 ml of blood and clots.  Dilated left fallopian  tube containing ectopic gestation. Uterus with submucosal fibroid, ~ 4 cm visualized posteriorly.  normal right fallopian tube, left ovary and right ovary was noted to have some adhesions to the posterior surface of the uterus. Upper abdomen surveyed, significant for Fitz-Hugh-Curtis syndrome (violin string-like adhesions of liver capsule).   Estimated Blood Loss:  300 ml of hemoperitoneum, minimal (<10 ml) from surgical blood loss.      Drains: straight catheterization prior to procedure with  50 ml of clear urine         Total IV Fluids:  1500 ml  Specimens: left fallopian tube containing ectopic gestation         Implants: None         Complications:  None; patient tolerated the procedure well.         Disposition: PACU - hemodynamically stable.         Condition: stable   Rubie Maid, MD [facility]

## 2020-08-20 NOTE — Anesthesia Procedure Notes (Signed)
Procedure Name: Intubation Date/Time: 08/20/2020 5:16 PM Performed by: Aline Brochure, CRNA Pre-anesthesia Checklist: Patient identified, Patient being monitored, Timeout performed, Emergency Drugs available and Suction available Patient Re-evaluated:Patient Re-evaluated prior to induction Oxygen Delivery Method: Circle system utilized Preoxygenation: Pre-oxygenation with 100% oxygen Induction Type: IV induction Ventilation: Mask ventilation without difficulty Laryngoscope Size: 3 and McGraph Grade View: Grade I Tube type: Oral Tube size: 7.0 mm Number of attempts: 1 Airway Equipment and Method: Stylet Placement Confirmation: ETT inserted through vocal cords under direct vision,  positive ETCO2 and breath sounds checked- equal and bilateral Secured at: 21 cm Tube secured with: Tape Dental Injury: Teeth and Oropharynx as per pre-operative assessment

## 2020-08-20 NOTE — Discharge Instructions (Signed)
AMBULATORY SURGERY  DISCHARGE INSTRUCTIONS   1) The drugs that you were given will stay in your system until tomorrow so for the next 24 hours you should not:  A) Drive an automobile B) Make any legal decisions C) Drink any alcoholic beverage   2) You may resume regular meals tomorrow.  Today it is better to start with liquids and gradually work up to solid foods.  You may eat anything you prefer, but it is better to start with liquids, then soup and crackers, and gradually work up to solid foods.   3) Please notify your doctor immediately if you have any unusual bleeding, trouble breathing, redness and pain at the surgery site, drainage, fever, or pain not relieved by medication.    4) Additional Instructions:        Please contact your physician with any problems or Same Day Surgery at [phone], Monday through Friday 6 am to 4 pm, or [place] [facility] number at [phone].Diagnostic Laparoscopy, Care After The following information offers guidance on how to care for yourself after your procedure. Your health care provider may also give you more specific instructions. If you have problems or questions, contact your health care provider. What can I expect after the procedure? After the procedure, it is common to have:  Mild discomfort in the abdomen.  Sore throat. Women who have laparoscopy with a pelvic examination may have mild cramping and fluid coming from the vagina for a few days after the procedure. Follow these instructions at home: Medicines  Take over-the-counter and prescription medicines only as told by your health care provider.  If you were prescribed an antibiotic medicine, take it as told by your health care provider. Do not stop taking the antibiotic even if you start to feel better.  Ask your health care provider if the medicine prescribed to you: ? Requires you to avoid driving or using machinery. ? Can cause constipation. You may need  to take these actions to prevent or treat constipation:  Drink enough fluid to keep your urine pale yellow.  Take over-the-counter or prescription medicines.  Eat foods that are high in fiber, such as beans, whole grains, and fresh fruits and vegetables.  Limit foods that are high in fat and processed sugars, such as fried or sweet foods. Incision care  Follow instructions from your health care provider about how to take care of your incisions. Make sure you: ? Wash your hands with soap and water for at least 20 seconds before and after you change your bandage (dressing). If soap and water are not available, use hand sanitizer. ? Change your dressing as told by your health care provider. ? Leave stitches (sutures), skin glue, or surgical tape in place. These skin closures may need to stay in place for 2 weeks or longer. If surgical tape edges start to loosen and curl up, you may trim the loose edges. Do not remove the surgical tape completely unless your health care provider tells you to do that.  Check your incision areas every day for signs of infection. Check for: ? Redness, swelling, or pain. ? Fluid or blood. ? Warmth. ? Pus or a bad smell.   Activity  Return to your normal activities as told by your health care provider. Ask your health care provider what activities are safe for you.  Do not lift anything that is heavier than 10 lb (4.5 kg), or the limit that you are told, until your health care provider  says that it is safe.  Avoid sitting for a long time without moving. Get up to take short walks every 1-2 hours. This is important to improve blood flow and breathing. Ask for help if you feel weak or unsteady. General instructions  Do not use any products that contain nicotine or tobacco. These products include cigarettes, chewing tobacco, and vaping devices, such as e-cigarettes. If you need help quitting, ask your health care provider.  If you were given a sedative during the  procedure, it can affect you for several hours. Do not drive or operate machinery until your health care provider says that it is safe.  Do not take baths, swim, or use a hot tub until your health care provider approves. Ask your health care provider if you may take showers. You may only be allowed to take sponge baths.  Keep all follow-up visits. This is important. Contact a health care provider if:  You develop shoulder pain.  You feel light-headed or faint.  You are unable to pass gas or have a bowel movement.  You feel nauseous or you vomit.  You develop a rash.  You have any of these signs of infection: ? Redness, swelling, or pain around an incision. ? Fluid or blood coming from an incision. ? Warmth coming from an incision. ? Pus or a bad smell coming from an incision. ? A fever or chills. Get help right away if:  You have severe pain.  You have vomiting that does not go away.  You have heavy bleeding from the vagina.  Any incision opens up.  You have trouble breathing.  You have chest pain. These symptoms may represent a serious problem that is an emergency. Do not wait to see if the symptoms will go away. Get medical help right away. Call your local emergency services (911 in the [place] Do not drive yourself to the hospital. Summary  After the procedure, it is common to have mild discomfort in the abdomen and a sore throat.  Check your incision areas every day for signs of infection.  Return to your normal activities as told by your health care provider. Ask your health care provider what activities are safe for you. This information is not intended to replace advice given to you by your health care provider. Make sure you discuss any questions you have with your health care provider. Document Revised: 11/18/2019 Document Reviewed: 11/18/2019 Elsevier Patient Education  2021 [employer].    Ectopic Pregnancy  An ectopic pregnancy happens when a  fertilized egg grows outside of the womb (uterus). Fertilized means that sperm entered the egg. The egg cannot stay alive outside of the womb. What are the causes? The most common cause is damage to a fallopian tube. This damage stops the egg from getting to the womb. Instead, the egg stays in the tube. Sometimes, an ectopic pregnancy happens in other parts of the body. What increases the risk?  Getting treatment before to help you have a baby.  A past pregnancy outside of the womb.  A past surgery to have your tubes tied.  Getting pregnant while using a device in the womb to avoid getting pregnant.  Taking birth control pills before the age of 61.  Smoking or drinking alcohol.  Having a mother who took a medicine called DES many years ago. What are the signs or symptoms? Common symptoms of this condition include:  Missing a menstrual period.  Feeling like you may vomit.  Tiredness.  Breast pain.  Other signs that you are pregnant. Other symptoms may include:  Pain during sex.  Bleeding from the vagina.  Belly pain.  A fast heartbeat, low blood pressure, and sweating.  Pain or extra pressure while pooping (having a bowel movement). If your tube tears or bursts:  You may have sudden and very bad pain in your belly.  You may feel dizzy, weak, or light-headed.  You may faint.  You may have pain in your shoulder or neck. A torn or burst tube is an emergency. It can be life-threatening. How is this treated? This condition may be treated with:  Medicine. This may be given if: ? The pregnancy is found early and you are not bleeding. ? The tube has not torn or burst.  Surgery. This may be done to: ? Take out the pregnancy tissue. ? Stop bleeding. ? Take out part or all of the tube. ? Take out the womb. This is rare.  Blood tests.  Careful watching. Follow these instructions at home: Medicines  Take over-the-counter and prescription medicines only as told  by your doctor.  If told, take steps to prevent problems with pooping (constipation). You may need to: ? Drink enough fluid to keep your pee (urine) pale yellow. ? Take medicines. You will be told what medicines to take. ? Eat foods that are high in fiber. These include beans, whole grains, and fresh fruits and vegetables. ? Limit foods that are high in fat and sugar. These include fried or sweet foods.  Ask your doctor if you should avoid driving or using machines while you are taking your medicine. General instructions  Rest or limit your activities, if told to do this.  Do not have sex for 6 weeks or as told by your doctor.  Do not put tampons, vaginal cleaning wash (douche), or other things in your vagina. Do not use these things for 6 weeks or until your doctor says it is safe to use them.  Do not lift anything that is heavier than 10 lb (4.5 kg), or the limit that you are told.  Return to your normal activities when your doctor says that it is safe.  Keep all follow-up visits. Contact a doctor if:  You have a fever or chills.  You feel like you may vomit and you vomit. Get help right away if:  Your pain gets worse or is not helped by medicine.  You feel dizzy or weak.  You feel light-headed.  You faint.  You have sudden and very bad pain in your belly.  You have very bad pain in your shoulder or neck. Summary  An ectopic pregnancy happens when a fertilized egg grows outside the womb.  This is an emergency.  The most common cause is damage to one of the fallopian tubes.  This condition may be treated with medicine, surgery, blood tests, or careful watching. This information is not intended to replace advice given to you by your health care provider. Make sure you discuss any questions you have with your health care provider. Document Revised: 07/15/2019 Document Reviewed: 07/05/2019 Elsevier Patient Education  [place].

## 2020-08-21 ENCOUNTER — Encounter: Payer: Self-pay | Admitting: Obstetrics and Gynecology

## 2020-08-21 NOTE — Anesthesia Postprocedure Evaluation (Signed)
Anesthesia Post Note  Patient: Brenda Aguirre  Procedure(s) Performed: DIAGNOSTIC LAPAROSCOPY WITH REMOVAL OF ECTOPIC PREGNANCY; SALPINGECTOMY (Left )  Patient location during evaluation: PACU Anesthesia Type: General Level of consciousness: awake and alert Pain management: pain level controlled Vital Signs Assessment: post-procedure vital signs reviewed and stable Respiratory status: spontaneous breathing, nonlabored ventilation, respiratory function stable and patient connected to nasal cannula oxygen Cardiovascular status: blood pressure returned to baseline and stable Postop Assessment: no apparent nausea or vomiting Anesthetic complications: no   No complications documented.   Last Vitals:  Vitals:   08/20/20 1930 08/20/20 2000  BP: 119/70   Pulse: 68   Resp: 14   Temp:  36.6 C  SpO2: 100%     Last Pain:  Vitals:   08/20/20 2000  TempSrc:   PainSc: 4                  Precious Haws Meliss Fleek

## 2020-08-22 LAB — SURGICAL PATHOLOGY

## 2020-08-23 NOTE — Telephone Encounter (Signed)
Called pt to make sure that she picked up her medication on Monday. Pt stated that she picked up her medication the next day.

## 2020-08-31 ENCOUNTER — Encounter: Payer: Self-pay | Admitting: Obstetrics and Gynecology

## 2020-09-06 ENCOUNTER — Encounter: Payer: Self-pay | Admitting: Obstetrics and Gynecology

## 2020-10-25 ENCOUNTER — Ambulatory Visit: Payer: Self-pay | Admitting: Physician Assistant

## 2020-10-25 ENCOUNTER — Other Ambulatory Visit: Payer: Self-pay

## 2020-10-25 DIAGNOSIS — Z113 Encounter for screening for infections with a predominantly sexual mode of transmission: Secondary | ICD-10-CM

## 2020-10-25 LAB — WET PREP FOR TRICH, YEAST, CLUE
Trichomonas Exam: NEGATIVE
Yeast Exam: NEGATIVE

## 2020-10-25 NOTE — Progress Notes (Signed)
Pt here for STD screening.  Wet mount results reviewed, no treatment required per Provider.  Pt declined condoms. Windle Guard, RN

## 2020-10-26 ENCOUNTER — Encounter: Payer: Self-pay | Admitting: Physician Assistant

## 2020-10-26 NOTE — Progress Notes (Signed)
[facility] Department STI clinic/screening visit  Subjective:  Brenda Aguirre is a 25 y.o. female being seen today for an STI screening visit. The patient reports they do not have symptoms.  Patient reports that they do desire a pregnancy in the next year.   They reported they are not interested in discussing contraception today.  Patient's last menstrual period was 10/18/2020.   Patient has the following medical conditions:   Patient Active Problem List   Diagnosis Date Noted   Ectopic pregnancy without intrauterine pregnancy 08/04/2020   Vaginal bleeding affecting early pregnancy 08/04/2020   Uterine fibroid 08/04/2020   Rh negative state in antepartum period, first trimester 08/04/2020   Cigar smoker 2/day + vapes 02/21/2020   Marijuana use 02/21/2020   Syphilis 02/07/2019   Overweight 01/06/2018    Chief Complaint  Patient presents with   SEXUALLY TRANSMITTED DISEASE    Screening    HPI  Patient reports that she is not having any symptoms but would like a screening today.  Reports a history of asthma for which she uses an inhaler as needed.  Denies regular medicines.  LMP 10/18/2020 and is not using any BCM.  States last HIV test was 18 months ago and last pap in 2019.  Patient reports recent ectopic pregnancy with salpingectomy about 2 months ago.    See flowsheet for further details and programmatic requirements.    The following portions of the patient's history were reviewed and updated as appropriate: allergies, current medications, past medical history, past social history, past surgical history and problem list.  Objective:  There were no vitals filed for this visit.  Physical Exam Constitutional:      General: She is not in acute distress.    Appearance: Normal appearance.  HENT:     Head: Normocephalic and atraumatic.     Comments: No nits,lice, or hair loss. No cervical, supraclavicular or axillary adenopathy.     Mouth/Throat:     Mouth:  Mucous membranes are moist.     Pharynx: Oropharynx is clear. No oropharyngeal exudate or posterior oropharyngeal erythema.  Eyes:     Conjunctiva/sclera: Conjunctivae normal.  Pulmonary:     Effort: Pulmonary effort is normal.  Abdominal:     Palpations: Abdomen is soft. There is no mass.     Tenderness: There is no abdominal tenderness. There is no guarding or rebound.  Genitourinary:    General: Normal vulva.     Rectum: Normal.     Comments: External genitalia/pubic area without nits, lice, edema, erythema, lesions and inguinal adenopathy. Vagina with normal mucosa and discharge. Cervix without visible lesions. Uterus firm, mobile, nt, no masses, no CMT, no adnexal tenderness or fullness.  Musculoskeletal:     Cervical back: Neck supple. No tenderness.  Skin:    General: Skin is warm and dry.     Findings: No bruising, erythema, lesion or rash.  Neurological:     Mental Status: She is alert and oriented to person, place, and time.  Psychiatric:        Mood and Affect: Mood normal.        Behavior: Behavior normal.        Thought Content: Thought content normal.        Judgment: Judgment normal.     Assessment and Plan:  Brenda Aguirre is a 25 y.o. female presenting to the [facility] Department for STI screening  1. Screening for STD (sexually transmitted disease) Patient into clinic without  symptoms. Patient declines blood work today. Reviewed wet mount results and no treatment is indicated. Rec condoms with all sex. Await test results.  Counseled that RN will call if needs to RTC for treatment once results are back.  - WET PREP FOR TRICH, YEAST, CLUE - Gonococcus culture - Chlamydia/Gonorrhea [facility] - Gonococcus culture     No follow-ups on file.  No future appointments.  Jerene Dilling, PA

## 2020-10-30 LAB — GONOCOCCUS CULTURE
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
## 2020-12-03 ENCOUNTER — Ambulatory Visit (LOCAL_COMMUNITY_HEALTH_CENTER): Payer: Self-pay

## 2020-12-03 ENCOUNTER — Other Ambulatory Visit: Payer: Self-pay

## 2020-12-03 VITALS — BP 113/68 | Ht 61.0 in | Wt 147.5 lb

## 2020-12-03 DIAGNOSIS — Z3201 Encounter for pregnancy test, result positive: Secondary | ICD-10-CM

## 2020-12-03 LAB — PREGNANCY, URINE: Preg Test, Ur: POSITIVE — AB

## 2020-12-03 MED ORDER — PRENATAL 27-0.8 MG PO TABS
1.0000 | ORAL_TABLET | Freq: Every day | ORAL | 0 refills | Status: AC
Start: 2020-12-03 — End: 2021-03-13

## 2020-12-03 NOTE — Progress Notes (Addendum)
UPT positive. Plans prenatal care at [facility]. Reports ectopic preg 08/2020. Consult with E Sciora, CNM who recommends pt to establish prenatal care ASAP and for  RN to counsel pt of higher risk of ectopic preg after having one ectopic preg and to seek immediate med attn/go to ER if abd pain on either side that worsens/ does not subside with hydration and rest / or bleeding. Pt in agreement. To DSS clerk for medicaid/preg women. Josie Saunders, RN  Consulted on the plan of care for this client.  I agree with the documented note and actions taken to provide care for this client.  Ola Spurr, CNM

## 2020-12-12 ENCOUNTER — Emergency Department: Payer: Self-pay | Admitting: Anesthesiology

## 2020-12-12 ENCOUNTER — Other Ambulatory Visit: Payer: Self-pay

## 2020-12-12 ENCOUNTER — Encounter
Admission: EM | Disposition: A | Discharge status: Home / Self Care | Payer: Self-pay | Source: Home / Self Care | Attending: Emergency Medicine

## 2020-12-12 ENCOUNTER — Ambulatory Visit
Admission: EM | Admit: 2020-12-12 | Discharge: 2020-12-12 | Disposition: A | Discharge status: Home / Self Care | Payer: Self-pay | Attending: Obstetrics and Gynecology | Admitting: Obstetrics and Gynecology

## 2020-12-12 ENCOUNTER — Telehealth: Payer: Self-pay | Admitting: Obstetrics and Gynecology

## 2020-12-12 ENCOUNTER — Emergency Department: Payer: Self-pay

## 2020-12-12 DIAGNOSIS — R102 Pelvic and perineal pain: Secondary | ICD-10-CM

## 2020-12-12 DIAGNOSIS — Z8759 Personal history of other complications of pregnancy, childbirth and the puerperium: Secondary | ICD-10-CM

## 2020-12-12 DIAGNOSIS — Z79899 Other long term (current) drug therapy: Secondary | ICD-10-CM | POA: Insufficient documentation

## 2020-12-12 DIAGNOSIS — Z791 Long term (current) use of non-steroidal anti-inflammatories (NSAID): Secondary | ICD-10-CM | POA: Insufficient documentation

## 2020-12-12 DIAGNOSIS — O00101 Right tubal pregnancy without intrauterine pregnancy: Secondary | ICD-10-CM | POA: Insufficient documentation

## 2020-12-12 DIAGNOSIS — Z3491 Encounter for supervision of normal pregnancy, unspecified, first trimester: Secondary | ICD-10-CM

## 2020-12-12 DIAGNOSIS — Z3A01 Less than 8 weeks gestation of pregnancy: Secondary | ICD-10-CM | POA: Insufficient documentation

## 2020-12-12 DIAGNOSIS — K661 Hemoperitoneum: Secondary | ICD-10-CM

## 2020-12-12 DIAGNOSIS — Z20822 Contact with and (suspected) exposure to covid-19: Secondary | ICD-10-CM | POA: Insufficient documentation

## 2020-12-12 DIAGNOSIS — Z8249 Family history of ischemic heart disease and other diseases of the circulatory system: Secondary | ICD-10-CM | POA: Insufficient documentation

## 2020-12-12 DIAGNOSIS — Z7951 Long term (current) use of inhaled steroids: Secondary | ICD-10-CM | POA: Insufficient documentation

## 2020-12-12 HISTORY — PX: DIAGNOSTIC LAPAROSCOPY WITH REMOVAL OF ECTOPIC PREGNANCY: SHX6449

## 2020-12-12 LAB — CBC
HCT: 33.4 % — ABNORMAL LOW (ref 36.0–46.0)
Hemoglobin: 12.4 g/dL (ref 12.0–15.0)
MCH: 27 pg (ref 26.0–34.0)
MCHC: 37.1 g/dL — ABNORMAL HIGH (ref 30.0–36.0)
MCV: 72.6 fL — ABNORMAL LOW (ref 80.0–100.0)
Platelets: 289 10*3/uL (ref 150–400)
RBC: 4.6 MIL/uL (ref 3.87–5.11)
RDW: 19.7 % — ABNORMAL HIGH (ref 11.5–15.5)
WBC: 4.8 10*3/uL (ref 4.0–10.5)
nRBC: 0 % (ref 0.0–0.2)

## 2020-12-12 LAB — URINALYSIS, COMPLETE (UACMP) WITH MICROSCOPIC
Bilirubin Urine: NEGATIVE
Glucose, UA: NEGATIVE mg/dL
Hgb urine dipstick: NEGATIVE
Ketones, ur: 15 mg/dL — AB
Leukocytes,Ua: NEGATIVE
Nitrite: NEGATIVE
Protein, ur: NEGATIVE mg/dL
Specific Gravity, Urine: 1.025 (ref 1.005–1.030)
pH: 5.5 (ref 5.0–8.0)

## 2020-12-12 LAB — COMPREHENSIVE METABOLIC PANEL
ALT: 14 U/L (ref 0–44)
AST: 17 U/L (ref 15–41)
Albumin: 4 g/dL (ref 3.5–5.0)
Alkaline Phosphatase: 39 U/L (ref 38–126)
Anion gap: 6 (ref 5–15)
BUN: 7 mg/dL (ref 6–20)
CO2: 24 mmol/L (ref 22–32)
Calcium: 9.1 mg/dL (ref 8.9–10.3)
Chloride: 105 mmol/L (ref 98–111)
Creatinine, Ser: 0.58 mg/dL (ref 0.44–1.00)
GFR, Estimated: >60 mL/min (ref >60)
Glucose, Bld: 118 mg/dL — ABNORMAL HIGH (ref 70–99)
Potassium: 3.4 mmol/L — ABNORMAL LOW (ref 3.5–5.1)
Sodium: 135 mmol/L (ref 135–145)
Total Bilirubin: 0.8 mg/dL (ref 0.3–1.2)
Total Protein: 7.2 g/dL (ref 6.5–8.1)

## 2020-12-12 LAB — TYPE AND SCREEN
ABO/RH(D): A NEG
Antibody Screen: NEGATIVE

## 2020-12-12 LAB — HCG, QUANTITATIVE, PREGNANCY: hCG, Beta Chain, Quant, S: 30978 m[IU]/mL — ABNORMAL HIGH (ref <5)

## 2020-12-12 LAB — RESP PANEL BY RT-PCR (FLU A&B, COVID) ARPGX2
Influenza A by PCR: NEGATIVE
Influenza B by PCR: NEGATIVE
SARS Coronavirus 2 by RT PCR: NEGATIVE

## 2020-12-12 LAB — LIPASE, BLOOD: Lipase: 31 U/L (ref 11–51)

## 2020-12-12 SURGERY — LAPAROSCOPY, WITH ECTOPIC PREGNANCY SURGICAL TREATMENT
Anesthesia: General | Site: Abdomen | Laterality: Right

## 2020-12-12 MED ORDER — ACETAMINOPHEN 500 MG PO TABS: 1000.0000 mg | ORAL_TABLET | Freq: Four times a day (QID) | ORAL | 0 refills | Status: AC | PRN

## 2020-12-12 MED ORDER — FENTANYL CITRATE (PF) 100 MCG/2ML IJ SOLN
INTRAMUSCULAR | Status: AC
  Filled 2020-12-12: qty 2

## 2020-12-12 MED ORDER — ONDANSETRON HCL 4 MG/2ML IJ SOLN
INTRAMUSCULAR | Status: DC | PRN
  Administered 2020-12-12: 4 mg via INTRAVENOUS

## 2020-12-12 MED ORDER — ONDANSETRON HCL 4 MG/2ML IJ SOLN: 4.0000 mg | Freq: Once | INTRAMUSCULAR | Status: DC | PRN

## 2020-12-12 MED ORDER — SODIUM CHLORIDE 0.9 % IV BOLUS
1000.0000 mL | Freq: Once | INTRAVENOUS | Status: AC
  Administered 2020-12-12: 1000 mL via INTRAVENOUS

## 2020-12-12 MED ORDER — PROPOFOL 10 MG/ML IV BOLUS
INTRAVENOUS | Status: AC
  Filled 2020-12-12: qty 20

## 2020-12-12 MED ORDER — OXYCODONE HCL 5 MG/5ML PO SOLN: 5.0000 mg | Freq: Once | ORAL | Status: AC | PRN

## 2020-12-12 MED ORDER — SUGAMMADEX SODIUM 500 MG/5ML IV SOLN
INTRAVENOUS | Status: AC
  Filled 2020-12-12: qty 5

## 2020-12-12 MED ORDER — VASOPRESSIN 20 UNIT/ML IV SOLN
INTRAVENOUS | Status: AC
  Filled 2020-12-12: qty 1

## 2020-12-12 MED ORDER — OXYCODONE HCL 5 MG PO TABS: 5.0000 mg | ORAL_TABLET | Freq: Four times a day (QID) | ORAL | 0 refills | Status: AC | PRN

## 2020-12-12 MED ORDER — BUPIVACAINE LIPOSOME 1.3 % IJ SUSP
INTRAMUSCULAR | Status: DC | PRN
  Administered 2020-12-12: 14 mL

## 2020-12-12 MED ORDER — DEXAMETHASONE SODIUM PHOSPHATE 10 MG/ML IJ SOLN
INTRAMUSCULAR | Status: AC
  Filled 2020-12-12: qty 1

## 2020-12-12 MED ORDER — ACETAMINOPHEN 10 MG/ML IV SOLN
INTRAVENOUS | Status: AC
  Filled 2020-12-12: qty 100

## 2020-12-12 MED ORDER — PROPOFOL 10 MG/ML IV BOLUS
INTRAVENOUS | Status: DC | PRN
  Administered 2020-12-12: 150 mg via INTRAVENOUS

## 2020-12-12 MED ORDER — OXYCODONE HCL 5 MG PO TABS
ORAL_TABLET | ORAL | Status: AC
  Administered 2020-12-12: 5 mg via ORAL
  Filled 2020-12-12: qty 1

## 2020-12-12 MED ORDER — SUGAMMADEX SODIUM 200 MG/2ML IV SOLN
INTRAVENOUS | Status: DC | PRN
  Administered 2020-12-12: 300 mg via INTRAVENOUS

## 2020-12-12 MED ORDER — ALBUTEROL SULFATE HFA 108 (90 BASE) MCG/ACT IN AERS
INHALATION_SPRAY | RESPIRATORY_TRACT | Status: DC | PRN
  Administered 2020-12-12: 2 via RESPIRATORY_TRACT

## 2020-12-12 MED ORDER — ONDANSETRON HCL 4 MG/2ML IJ SOLN
4.0000 mg | Freq: Once | INTRAMUSCULAR | Status: AC
  Administered 2020-12-12: 4 mg via INTRAVENOUS
  Filled 2020-12-12: qty 2

## 2020-12-12 MED ORDER — PHENYLEPHRINE HCL (PRESSORS) 10 MG/ML IV SOLN
INTRAVENOUS | Status: AC
  Filled 2020-12-12: qty 1

## 2020-12-12 MED ORDER — 0.9 % SODIUM CHLORIDE (POUR BTL) OPTIME
TOPICAL | Status: DC | PRN
  Administered 2020-12-12: 500 mL

## 2020-12-12 MED ORDER — MIDAZOLAM HCL 2 MG/2ML IJ SOLN
INTRAMUSCULAR | Status: DC | PRN
  Administered 2020-12-12: 2 mg via INTRAVENOUS

## 2020-12-12 MED ORDER — IBUPROFEN 600 MG PO TABS: 600.0000 mg | ORAL_TABLET | Freq: Four times a day (QID) | ORAL | 0 refills | Status: DC | PRN

## 2020-12-12 MED ORDER — RHO D IMMUNE GLOBULIN 1500 UNIT/2ML IJ SOSY
300.0000 ug | PREFILLED_SYRINGE | Freq: Once | INTRAMUSCULAR | Status: AC
  Administered 2020-12-12: 300 ug via INTRAVENOUS
  Filled 2020-12-12: qty 2

## 2020-12-12 MED ORDER — ACETAMINOPHEN 10 MG/ML IV SOLN: 1000.0000 mg | Freq: Once | INTRAVENOUS | Status: DC | PRN

## 2020-12-12 MED ORDER — PHENYLEPHRINE HCL (PRESSORS) 10 MG/ML IV SOLN
INTRAVENOUS | Status: DC | PRN
  Administered 2020-12-12 (×2): 100 ug via INTRAVENOUS

## 2020-12-12 MED ORDER — ALBUTEROL SULFATE HFA 108 (90 BASE) MCG/ACT IN AERS
INHALATION_SPRAY | RESPIRATORY_TRACT | Status: AC
  Filled 2020-12-12: qty 6.7

## 2020-12-12 MED ORDER — SEVOFLURANE IN SOLN
RESPIRATORY_TRACT | Status: AC
  Filled 2020-12-12: qty 250

## 2020-12-12 MED ORDER — DEXAMETHASONE SODIUM PHOSPHATE 10 MG/ML IJ SOLN
INTRAMUSCULAR | Status: DC | PRN
  Administered 2020-12-12: 10 mg via INTRAVENOUS

## 2020-12-12 MED ORDER — ROCURONIUM BROMIDE 100 MG/10ML IV SOLN
INTRAVENOUS | Status: DC | PRN
  Administered 2020-12-12: 50 mg via INTRAVENOUS
  Administered 2020-12-12: 20 mg via INTRAVENOUS

## 2020-12-12 MED ORDER — ACETAMINOPHEN 10 MG/ML IV SOLN
INTRAVENOUS | Status: DC | PRN
  Administered 2020-12-12: 1000 mg via INTRAVENOUS

## 2020-12-12 MED ORDER — BUPIVACAINE LIPOSOME 1.3 % IJ SUSP
INTRAMUSCULAR | Status: AC
  Filled 2020-12-12: qty 20

## 2020-12-12 MED ORDER — SODIUM CHLORIDE FLUSH 0.9 % IV SOLN
INTRAVENOUS | Status: AC
  Filled 2020-12-12: qty 10

## 2020-12-12 MED ORDER — LIDOCAINE HCL (PF) 2 % IJ SOLN
INTRAMUSCULAR | Status: AC
  Filled 2020-12-12: qty 5

## 2020-12-12 MED ORDER — ROCURONIUM BROMIDE 10 MG/ML (PF) SYRINGE
PREFILLED_SYRINGE | INTRAVENOUS | Status: AC
  Filled 2020-12-12: qty 10

## 2020-12-12 MED ORDER — LACTATED RINGERS IV SOLN: INTRAVENOUS | Status: DC

## 2020-12-12 MED ORDER — FENTANYL CITRATE (PF) 100 MCG/2ML IJ SOLN
INTRAMUSCULAR | Status: AC
  Administered 2020-12-12: 50 ug via INTRAVENOUS
  Filled 2020-12-12: qty 2

## 2020-12-12 MED ORDER — FENTANYL CITRATE (PF) 100 MCG/2ML IJ SOLN
INTRAMUSCULAR | Status: DC | PRN
  Administered 2020-12-12 (×2): 50 ug via INTRAVENOUS

## 2020-12-12 MED ORDER — FENTANYL CITRATE (PF) 100 MCG/2ML IJ SOLN
25.0000 ug | INTRAMUSCULAR | Status: DC | PRN
  Administered 2020-12-12: 50 ug via INTRAVENOUS

## 2020-12-12 MED ORDER — DEXMEDETOMIDINE (PRECEDEX) IN NS 20 MCG/5ML (4 MCG/ML) IV SYRINGE
PREFILLED_SYRINGE | INTRAVENOUS | Status: DC | PRN
  Administered 2020-12-12: 4 ug via INTRAVENOUS
  Administered 2020-12-12 (×2): 8 ug via INTRAVENOUS

## 2020-12-12 MED ORDER — MORPHINE SULFATE (PF) 4 MG/ML IV SOLN
4.0000 mg | Freq: Once | INTRAVENOUS | Status: AC
  Administered 2020-12-12: 4 mg via INTRAVENOUS
  Filled 2020-12-12: qty 1

## 2020-12-12 MED ORDER — MIDAZOLAM HCL 2 MG/2ML IJ SOLN
INTRAMUSCULAR | Status: AC
  Filled 2020-12-12: qty 2

## 2020-12-12 MED ORDER — ONDANSETRON HCL 4 MG/2ML IJ SOLN
INTRAMUSCULAR | Status: AC
  Filled 2020-12-12: qty 2

## 2020-12-12 MED ORDER — OXYCODONE HCL 5 MG PO TABS: 5.0000 mg | ORAL_TABLET | Freq: Once | ORAL | Status: AC | PRN

## 2020-12-12 MED ORDER — DEXMEDETOMIDINE (PRECEDEX) IN NS 20 MCG/5ML (4 MCG/ML) IV SYRINGE
PREFILLED_SYRINGE | INTRAVENOUS | Status: AC
  Filled 2020-12-12: qty 5

## 2020-12-12 MED ORDER — POVIDONE-IODINE 10 % EX SWAB: 2.0000 "application " | Freq: Once | CUTANEOUS | Status: DC

## 2020-12-12 MED ORDER — LIDOCAINE HCL (CARDIAC) PF 100 MG/5ML IV SOSY
PREFILLED_SYRINGE | INTRAVENOUS | Status: DC | PRN
  Administered 2020-12-12: 60 mg via INTRAVENOUS

## 2020-12-12 SURGICAL SUPPLY — 78 items
APPLICATOR ARISTA FLEXITIP XL (MISCELLANEOUS) IMPLANT
BAG LAPAROSCOPIC 12 15 PORT 16 (BASKET) IMPLANT
BAG RETRIEVAL 12/15 (BASKET)
BASIN GRAD PLASTIC 32OZ STRL (MISCELLANEOUS) ×3 IMPLANT
BLADE SURG 11 STRL SS (BLADE) ×3 IMPLANT
BLADE SURG SZ10 CARB STEEL (BLADE) IMPLANT
BNDG ADH 2 X3.75 FABRIC TAN LF (GAUZE/BANDAGES/DRESSINGS) ×3 IMPLANT
CANNULA REDUC XI 12-8 STAPL (CANNULA)
CANNULA REDUCER 12-8 DVNC XI (CANNULA) IMPLANT
CATH FOL 2WAY LX 16X5 (CATHETERS) ×3 IMPLANT
CATH ROBINSON RED A/P 16FR (CATHETERS) ×3 IMPLANT
CHLORAPREP W/TINT 26 (MISCELLANEOUS) ×3 IMPLANT
COVER MAYO STAND REUSABLE (DRAPES) ×3 IMPLANT
COVER TIP SHEARS 8 DVNC (MISCELLANEOUS) IMPLANT
COVER TIP SHEARS 8MM DA VINCI (MISCELLANEOUS)
COVER WAND RF STERILE (DRAPES) IMPLANT
DEFOGGER SCOPE WARMER CLEARIFY (MISCELLANEOUS) ×3 IMPLANT
DERMABOND ADVANCED (GAUZE/BANDAGES/DRESSINGS) ×1
DERMABOND ADVANCED .7 DNX12 (GAUZE/BANDAGES/DRESSINGS) ×2 IMPLANT
DRAPE ARM DVNC X/XI (DISPOSABLE) IMPLANT
DRAPE COLUMN DVNC XI (DISPOSABLE) IMPLANT
DRAPE DA VINCI XI ARM (DISPOSABLE)
DRAPE DA VINCI XI COLUMN (DISPOSABLE)
DRAPE ROBOT W/ LEGGING 30X125 (DRAPES) IMPLANT
DRSG TEGADERM 2-3/8X2-3/4 SM (GAUZE/BANDAGES/DRESSINGS) ×9 IMPLANT
ELECT REM PT RETURN 9FT ADLT (ELECTROSURGICAL) ×3
ELECTRODE REM PT RTRN 9FT ADLT (ELECTROSURGICAL) ×2 IMPLANT
GAUZE 4X4 16PLY ~~LOC~~+RFID DBL (SPONGE) ×3 IMPLANT
GLOVE SURG ENC MOIS LTX SZ7 (GLOVE) ×6 IMPLANT
GLOVE SURG UNDER POLY LF SZ7.5 (GLOVE) ×6 IMPLANT
GOWN STRL REUS W/ TWL LRG LVL3 (GOWN DISPOSABLE) ×16 IMPLANT
GOWN STRL REUS W/TWL LRG LVL3 (GOWN DISPOSABLE) ×8
GRASPER SUT TROCAR 14GX15 (MISCELLANEOUS) ×6 IMPLANT
HEMOSTAT ARISTA ABSORB 3G PWDR (HEMOSTASIS) IMPLANT
IRRIGATION STRYKERFLOW (MISCELLANEOUS) ×2 IMPLANT
IRRIGATOR STRYKERFLOW (MISCELLANEOUS) ×3
IRRIGATOR SUCT 8 DISP DVNC XI (IRRIGATION / IRRIGATOR) IMPLANT
IRRIGATOR SUCTION 8MM XI DISP (IRRIGATION / IRRIGATOR)
IV NS 1000ML (IV SOLUTION)
IV NS 1000ML BAXH (IV SOLUTION) IMPLANT
KIT PINK PAD W/HEAD ARE REST (MISCELLANEOUS) ×3 IMPLANT
KIT PINK PAD W/HEAD ARM REST (MISCELLANEOUS) ×2 IMPLANT
LABEL OR SOLS (LABEL) ×3 IMPLANT
LIGASURE VESSEL 5MM BLUNT TIP (ELECTROSURGICAL) ×3 IMPLANT
MANIFOLD NEPTUNE II (INSTRUMENTS) ×3 IMPLANT
MANIPULATOR UTERINE 4.5 ZUMI (MISCELLANEOUS) ×3 IMPLANT
NEEDLE HYPO 22GX1.5 SAFETY (NEEDLE) ×3 IMPLANT
NS IRRIG 1000ML POUR BTL (IV SOLUTION) ×6 IMPLANT
OBTURATOR OPTICAL LONG 8 DVNC (TROCAR) IMPLANT
OBTURATOR OPTICAL LONG 8MM (TROCAR)
OBTURATOR OPTICAL STANDARD 8MM (TROCAR)
OBTURATOR OPTICAL STND 8 DVNC (TROCAR)
OBTURATOR OPTICALSTD 8 DVNC (TROCAR) IMPLANT
PACK GYN LAPAROSCOPIC (MISCELLANEOUS) ×3 IMPLANT
PAD ARMBOARD 7.5X6 YLW CONV (MISCELLANEOUS) ×3 IMPLANT
PAD OB MATERNITY 4.3X12.25 (PERSONAL CARE ITEMS) ×3 IMPLANT
PAD PREP 24X41 OB/GYN DISP (PERSONAL CARE ITEMS) ×3 IMPLANT
POUCH SPECIMEN RETRIEVAL 10MM (ENDOMECHANICALS) ×3 IMPLANT
SCRUB EXIDINE 4% CHG 4OZ (MISCELLANEOUS) ×3 IMPLANT
SEAL CANN UNIV 5-8 DVNC XI (MISCELLANEOUS) ×6 IMPLANT
SEAL XI 5MM-8MM UNIVERSAL (MISCELLANEOUS) ×3
SEALER VESSEL DA VINCI XI (MISCELLANEOUS)
SEALER VESSEL EXT DVNC XI (MISCELLANEOUS) IMPLANT
SET TUBE SMOKE EVAC HIGH FLOW (TUBING) ×3 IMPLANT
SOLUTION ELECTROLUBE (MISCELLANEOUS) IMPLANT
SPONGE GAUZE 2X2 8PLY STRL LF (GAUZE/BANDAGES/DRESSINGS) ×9 IMPLANT
STAPLER CANNULA SEAL DVNC XI (STAPLE) ×2 IMPLANT
STAPLER CANNULA SEAL XI (STAPLE) ×1
SURGILUBE 2OZ TUBE FLIPTOP (MISCELLANEOUS) ×3 IMPLANT
SUT MNCRL 4-0 (SUTURE) ×1
SUT MNCRL 4-0 27XMFL (SUTURE) ×2
SUT VIC AB 0 CT1 36 (SUTURE) IMPLANT
SUT VICRYL 0 AB UR-6 (SUTURE) ×6 IMPLANT
SUTURE MNCRL 4-0 27XMF (SUTURE) ×2 IMPLANT
SYR 10ML LL (SYRINGE) ×3 IMPLANT
SYR CONTROL 10ML LL (SYRINGE) ×3 IMPLANT
TAPE TRANSPORE STRL 2 31045 (GAUZE/BANDAGES/DRESSINGS) ×3 IMPLANT
WATER STERILE IRR 500ML POUR (IV SOLUTION) ×3 IMPLANT

## 2020-12-12 NOTE — Anesthesia Procedure Notes (Signed)
Procedure Name: Intubation Date/Time: 12/12/2020 5:55 PM Performed by: Jonna Clark, CRNA Pre-anesthesia Checklist: Patient identified, Patient being monitored, Timeout performed, Emergency Drugs available and Suction available Patient Re-evaluated:Patient Re-evaluated prior to induction Oxygen Delivery Method: Circle system utilized Preoxygenation: Pre-oxygenation with 100% oxygen Induction Type: IV induction Ventilation: Mask ventilation without difficulty Laryngoscope Size: 3 and McGraph Grade View: Grade I Tube type: Oral Tube size: 7.0 mm Number of attempts: 1 Airway Equipment and Method: Stylet Placement Confirmation: ETT inserted through vocal cords under direct vision, positive ETCO2 and breath sounds checked- equal and bilateral Secured at: 21 cm Tube secured with: Tape Dental Injury: Teeth and Oropharynx as per pre-operative assessment

## 2020-12-12 NOTE — Discharge Instructions (Addendum)
AMBULATORY SURGERY  DISCHARGE INSTRUCTIONS   The drugs that you were given will stay in your system until tomorrow so for the next 24 hours you should not:  Drive an automobile Make any legal decisions Drink any alcoholic beverage   You may resume regular meals tomorrow.  Today it is better to start with liquids and gradually work up to solid foods.  You may eat anything you prefer, but it is better to start with liquids, then soup and crackers, and gradually work up to solid foods.   Please notify your doctor immediately if you have any unusual bleeding, trouble breathing, redness and pain at the surgery site, drainage, fever, or pain not relieved by medication.       Please contact your physician with any problems or Same Day Surgery at [phone], Monday through Friday 6 am to 4 pm, or [facility] number at [phone].  

## 2020-12-12 NOTE — ED Notes (Signed)
Pt currently signing consent with Dr Gilman Schmidt. OR tech is here to get pt.

## 2020-12-12 NOTE — ED Triage Notes (Signed)
Pt c/o lower abd pain with N/V , states she is about 2 mo pregnant with a hx of ectopic pregnancy in the past.

## 2020-12-12 NOTE — Telephone Encounter (Signed)
Brenda Aguirre called in and states she is pregnant and currently at [facility] due to ectopic pregnancy.  Patient called and stated she wanted to let Dr. Marcelline Mates know she is currently at [facility] and "they" are wanting to do surgery and she wants Dr. Marcelline Mates present.  I did inform patient that Dr. Marcelline Mates was seeing patients and Dr. Marcelline Mates is on call but I would inform Dr. Marcelline Mates.  Patient just reiterated she wanted Dr. Marcelline Mates to know because "they' said surgery.  I asked when they want to do surgery and she stated they didn't know.

## 2020-12-12 NOTE — ED Notes (Signed)
Called blood bank. Rhogam not ready yet.

## 2020-12-12 NOTE — ED Notes (Signed)
Called pharmacy. Rhogam not available in pyxis. They will investigate.

## 2020-12-12 NOTE — ED Notes (Signed)
Dr Gilman Schmidt (OB/surgeon) at bedside discussing surgery and obtaining consent.

## 2020-12-12 NOTE — ED Provider Notes (Signed)
[facility] Emergency Department Provider Note  ____________________________________________  Time seen: Approximately 2:07 PM  I have reviewed the triage vital signs and the nursing notes.   HISTORY  Chief Complaint Abdominal Pain    HPI Brenda Aguirre is a 25 y.o. female with a past history of uterine fibroids and ectopic pregnancy status post left salpingectomy who comes ED complaining of pelvic pain with nausea and vomiting.  Also reports being pregnant, LMP was 2 months ago.  Denies vaginal bleeding or discharge.  No fever or chills, no chest pain or shortness of breath.  Pelvic pain is waxing and waning, nonradiating, no aggravating or alleviating factors, moderate intensity.    Past Medical History:  Diagnosis Date   Asthma    Fibroids 08/04/2020     Patient Active Problem List   Diagnosis Date Noted   Ectopic pregnancy without intrauterine pregnancy 08/04/2020   Vaginal bleeding affecting early pregnancy 08/04/2020   Uterine fibroid 08/04/2020   Rh negative state in antepartum period, first trimester 08/04/2020   Cigar smoker 2/day + vapes 02/21/2020   Marijuana use 02/21/2020   Syphilis 02/07/2019   Overweight 01/06/2018     Past Surgical History:  Procedure Laterality Date   DIAGNOSTIC LAPAROSCOPY WITH REMOVAL OF ECTOPIC PREGNANCY Left 08/20/2020   Procedure: DIAGNOSTIC LAPAROSCOPY WITH REMOVAL OF ECTOPIC PREGNANCY; SALPINGECTOMY;  Surgeon: Rubie Maid, MD;  Location: [facility] ORS;  Service: Gynecology;  Laterality: Left;   NO PAST SURGERIES       Prior to Admission medications   Medication Sig Start Date End Date Taking? Authorizing Provider  acetaminophen (TYLENOL) 500 MG tablet Take 2 tablets (1,000 mg total) by mouth every 6 (six) hours as needed for mild pain. Patient not taking: Reported on 12/03/2020 08/20/20 08/20/21  Rubie Maid, MD  albuterol (PROVENTIL) (2.5 MG/3ML) 0.083% nebulizer solution Take 3 mLs (2.5 mg total) by  nebulization every 6 (six) hours as needed for wheezing or shortness of breath. 08/11/16   Johnn Hai, PA-C  albuterol (VENTOLIN HFA) 108 (90 Base) MCG/ACT inhaler Inhale 2-4 puffs into the lungs every 4 (four) hours as needed for wheezing or shortness of breath. 11/14/18   Hinda Kehr, MD  docusate sodium (COLACE) 100 MG capsule Take 1 capsule (100 mg total) by mouth 2 (two) times daily as needed. Patient not taking: Reported on 10/25/2020 08/20/20   Rubie Maid, MD  ibuprofen (ADVIL) 800 MG tablet Take 1 tablet (800 mg total) by mouth every 8 (eight) hours as needed. Patient not taking: Reported on 12/03/2020 08/20/20   Rubie Maid, MD  Prenatal Vit-Fe Fumarate-FA (MULTIVITAMIN-PRENATAL) 27-0.8 MG TABS tablet Take 1 tablet by mouth daily at 12 noon. 12/03/20 03/13/21  Caren Macadam, MD  simethicone (GAS-X) 80 MG chewable tablet Chew 1 tablet (80 mg total) by mouth 4 (four) times daily as needed for flatulence (bloating). Patient not taking: Reported on 10/25/2020 08/20/20 08/20/21  Rubie Maid, MD     Allergies Patient has no known allergies.   Family History  Problem Relation Age of Onset   Hypertension Maternal Grandmother    Healthy Mother    Healthy Father     Social History Social History   Tobacco Use   Smoking status: Every Day    Types: Cigars, E-cigarettes, Cigarettes   Smokeless tobacco: Never   Tobacco comments:    Smoking x4 years  Vaping Use   Vaping Use: Some days   Start date: 09/29/2018  Substance Use Topics   Alcohol use:  Not Currently    Alcohol/week: 7.0 standard drinks    Types: 3 Glasses of wine, 4 Shots of liquor per week    Comment: last use 11/05/20, mixed drink   Drug use: Yes    Frequency: 3.0 times per week    Types: Marijuana    Comment: every week    Review of Systems  Constitutional:   No fever or chills.  ENT:   No sore throat. No rhinorrhea. Cardiovascular:   No chest pain or syncope. Respiratory:   No dyspnea or  cough. Gastrointestinal:   Positive pelvic pain as above without vomiting or diarrhea.  Positive dysuria Musculoskeletal:   Negative for focal pain or swelling All other systems reviewed and are negative except as documented above in ROS and HPI.  ____________________________________________   PHYSICAL EXAM:  VITAL SIGNS: ED Triage Vitals [12/12/20 1255]  Enc Vitals Group     BP (!) 131/113     Pulse Rate (!) 102     Resp 18     Temp 98.3 F (36.8 C)     Temp Source Oral     SpO2 100 %     Weight 147 lb (66.7 kg)     Height 5' (1.524 m)     Head Circumference      Peak Flow      Pain Score 10     Pain Loc      Pain Edu?      Excl. in [place]?     Vital signs reviewed, nursing assessments reviewed.   Constitutional:   Alert and oriented. Non-toxic appearance. Eyes:   Conjunctivae are normal. EOMI. PERRL. ENT      Head:   Normocephalic and atraumatic.      Nose:   Wearing a mask.      Mouth/Throat:   Wearing a mask.      Neck:   No meningismus. Full ROM. Hematological/Lymphatic/Immunilogical:   No cervical lymphadenopathy. Cardiovascular:   RRR. Symmetric bilateral radial and DP pulses.  No murmurs. Cap refill less than 2 seconds. Respiratory:   Normal respiratory effort without tachypnea/retractions. Breath sounds are clear and equal bilaterally. No wheezes/rales/rhonchi. Gastrointestinal:   Soft with diffuse lower abdominal tenderness. Non distended. There is no CVA tenderness.  No rebound, rigidity, or guarding. Musculoskeletal:   Normal range of motion in all extremities. No joint effusions.  No lower extremity tenderness.  No edema. Neurologic:   Normal speech and language.  Motor grossly intact. No acute focal neurologic deficits are appreciated.  Skin:    Skin is warm, dry and intact. No rash noted.  No petechiae, purpura, or bullae.  ____________________________________________    LABS (pertinent positives/negatives) (all labs ordered are listed, but only  abnormal results are displayed) Labs Reviewed  COMPREHENSIVE METABOLIC PANEL - Abnormal; Notable for the following components:      Result Value   Potassium 3.4 (*)    Glucose, Bld 118 (*)    All other components within normal limits  CBC - Abnormal; Notable for the following components:   HCT 33.4 (*)    MCV 72.6 (*)    MCHC 37.1 (*)    RDW 19.7 (*)    All other components within normal limits  LIPASE, BLOOD  URINALYSIS, COMPLETE (UACMP) WITH MICROSCOPIC  HCG, QUANTITATIVE, PREGNANCY  POC URINE PREG, ED   ____________________________________________   EKG    ____________________________________________    RADIOLOGY  No results found.  ____________________________________________   PROCEDURES Procedures  ____________________________________________  DIFFERENTIAL  DIAGNOSIS   Ectopic pregnancy, functional trimester pain, nausea vomiting of pregnancy, dehydration, electrolyte abnormality  CLINICAL IMPRESSION / ASSESSMENT AND PLAN / ED COURSE  Medications ordered in the ED: Medications  ondansetron (ZOFRAN) injection 4 mg (has no administration in time range)  morphine 4 MG/ML injection 4 mg (has no administration in time range)  sodium chloride 0.9 % bolus 1,000 mL (1,000 mLs Intravenous New Bag/Given 12/12/20 1400)    Pertinent labs & imaging results that were available during my care of the patient were reviewed by me and considered in my medical decision making (see chart for details).  Brenda Aguirre was evaluated in Emergency Department on 12/12/2020 for the symptoms described in the history of present illness. She was evaluated in the context of the global COVID-19 pandemic, which necessitated consideration that the patient might be at risk for infection with the SARS-CoV-2 virus that causes COVID-19. Institutional protocols and algorithms that pertain to the evaluation of patients at risk for COVID-19 are in a state of rapid change based on information  released by regulatory bodies including the CDC and federal and state organizations. These policies and algorithms were followed during the patient's care in the ED.   Patient presents with pelvic pain and for trimester pregnancy.  Has a history of ectopic.  Will obtain pelvic ultrasound to confirm IUP.  Lab panels unremarkable.  Patient given IV fluids for dehydration and vital signs normalized.  Will give Zofran and morphine 4 mg IV for initial pain and nausea relief.  She has made an initial appointment with prenatal care which is in 2 weeks.      ____________________________________________   FINAL CLINICAL IMPRESSION(S) / ED DIAGNOSES    Final diagnoses:  First trimester pregnancy  Pelvic pain in female     ED Discharge Orders     None       Portions of this note were generated with dragon dictation software. Dictation errors may occur despite best attempts at proofreading.    Carrie Mew, MD 12/12/20 1410

## 2020-12-12 NOTE — ED Notes (Signed)
Pt in u/s. Partner at bedside.

## 2020-12-12 NOTE — ED Notes (Signed)
Attempting BP check. VS were overdue because pt had been in ultrasound.

## 2020-12-12 NOTE — Telephone Encounter (Signed)
Ok thanks.  I will look into this.

## 2020-12-12 NOTE — Op Note (Addendum)
  Operative Note   12/12/2020  PRE-OP DIAGNOSIS: Ectopic Pregnancy  POST-OP DIAGNOSIS: Right fallopian tube ectopic pregnancy   PROCEDURE: Procedure(s): LAPAROSCOPIC RIGHT SALPINGECTOMY   SURGEON: Adrian Prows MD  ASSISTANT: Malachy Mood MD  ANESTHESIA: General   ESTIMATED BLOOD LOSS: 10 cc  COMPLICATIONS: None  DISPOSITION: PACU - hemodynamically stable.  CONDITION: stable  FINDINGS: Laparoscopic survey of the abdomen revealed a ruptured right ectopic pregnancy inside an enlarged right fallopian tube. Grossly normal uterus, and ovaries, gallbladder edge and appendix. Significant abdominal adhesions were noted between the anterior abdominal wall and gallbladder.  Endometriosis like implants were noted along the anterior abdominal wall, at the cornua of the right fallopian tube, and in the cul-de-sac.  PROCEDURE IN DETAIL: The patient was taken to the OR where anesthesia was administed. The patient was positioned in dorsal lithotomy in the [place]. The patient was then examined under anesthesia with the above noted findings. The patient was prepped and draped in the normal sterile fashion and bladder was drained using a foley catheter. Speculum exam normal, uterus sounded to 7 cm and a zumi manipulator was placed for manipulation purposes.  Attention was turned to the patient's abdomen where a 8 mm skin incision was made in the umbilical fold, after injection of local anesthesia.  The 8 mm port was then placed under direct visualization with the operative laparoscope  The above noted findings. Pneumoperitoneum was obtained. Patient positioned into Trendelenburg.  A 8 mm trocar was then placed in a horizontal line across the upper abdomen under direct visualization with the laparoscope.  Right fallopian tubes are identified and followed out to its fimbria.  There was a hemoperitoneum approximately 200 cc.  The hemoperitoneum was evacuated with the suction irrigator.  The  right fallopian tube showed signs of rupture.  The right fallopian tube each tube was excised utilizing the LigaSure to include the fibria.  No injuries or bleeding was noted.  The right fallopian tube was attempted to be removed through the 8 mm port but was too enlarged.  The umbilical port was exchanged with a 12 mm port.  The the right fallopian tube was then placed into the laparoscopic bag and removed through the umbilical incision.    The umbilical port was removed.  The fascia was closed with the PMI device and interrupted sutures of 0 Vicryl on a UR 6 needle.  All instruments and ports were then removed from the abdomen after gas was expelled and patient was leveled.   The skin was closed with 4-0 monocryl and skin adhesive. The foley catheter was removed from the bladder. The manipulator was removed from the uterus.  The patient tolerated the procedure well. All counts were correct x 2. The patient was transferred to the recovery room awake, alert and breathing independently.  Dr. Georgianne Fick assisted with this case. This was a high level case requiring a Physicist, medical. No other assistant was readily available. He assisted with retraction of tissue and port placement during the case.    Adrian Prows MD [facility] 12/12/2020 7:43 PM

## 2020-12-12 NOTE — ED Notes (Signed)
Pt states has felt weak today but denies dizziness. Pt denies vaginal bleeding.

## 2020-12-12 NOTE — ED Notes (Signed)
Pt to ED 2 months pregnant c/o lower abdominal pain, constant, everything makes it worse: moving, rolling, peeing, pooping. Denies vaginal bleeding, denies burning with urination.  Whole lower back and abdomen is tender.

## 2020-12-12 NOTE — ED Provider Notes (Signed)
Patient signed out to me at 3 PM by Dr. Joni Fears.  25 year old female with a history of ectopic status post left salpingectomy who presents with pelvic pain and nausea and for semester pregnancy.  Patient's ultrasound shows a right-sided ectopic pregnancy with fetal pole and fetal heart rate, beta is 30,000.  She is hemodynamically stable hemoglobin of 12.  I discussed with Dr. Nechama Guard with GYN who is currently in clinic but will see the patient.  She is Rh- so we will give her RhoGAM.  COVID will be collected.  Patient was made aware of these findings.   Rada Hay, MD 12/12/20 [phone]

## 2020-12-12 NOTE — Transfer of Care (Signed)
Immediate Anesthesia Transfer of Care Note  Patient: Brenda Aguirre  Procedure(s) Performed: LAPAROSCOPIC Right salpingectomy (Right: Abdomen)  Patient Location: PACU  Anesthesia Type:General  Level of Consciousness: drowsy and patient cooperative  Airway & Oxygen Therapy: Patient Spontanous Breathing and Patient connected to face mask oxygen  Post-op Assessment: Report given to RN and Post -op Vital signs reviewed and stable  Post vital signs: Reviewed and stable  Last Vitals:  Vitals Value Taken Time  BP 116/78 12/12/20 1939  Temp 36.5 C 12/12/20 1939  Pulse 108 12/12/20 1942  Resp 15 12/12/20 1942  SpO2 100 % 12/12/20 1942  Vitals shown include unvalidated device data.  Last Pain:  Vitals:   12/12/20 1717  TempSrc: Oral  PainSc: 0-No pain         Complications: No notable events documented.

## 2020-12-12 NOTE — Anesthesia Preprocedure Evaluation (Signed)
Anesthesia Evaluation  Patient identified by MRN, date of birth, ID band Patient awake  General Assessment Comment:  Patient denies any nausea or vomiting. Last NPO last night  Reviewed: Allergy & Precautions, NPO status , Patient's Chart, lab work & pertinent test results  History of Anesthesia Complications Negative for: history of anesthetic complications  Airway Mallampati: II  TM Distance: >3 FB Neck ROM: Full    Dental no notable dental hx. (+) Teeth Intact   Pulmonary asthma , neg sleep apnea, neg COPD, Current SmokerPatient did not abstain from smoking.,  Took inhaler last night because she was feeling tight. Feels well today.  Last hospitalized many years back for asthma.   Pulmonary exam normal breath sounds clear to auscultation       Cardiovascular Exercise Tolerance: Good METS(-) hypertension(-) CAD and (-) Past MI (-) dysrhythmias  Rhythm:Regular Rate:Normal - Systolic murmurs    Neuro/Psych negative neurological ROS  negative psych ROS   GI/Hepatic neg GERD  ,(+)     (-) substance abuse  ,   Endo/Other  neg diabetes  Renal/GU negative Renal ROS     Musculoskeletal   Abdominal (+)  Abdomen: tender.    Peds  Hematology  (+) Blood dyscrasia, anemia ,   Anesthesia Other Findings Past Medical History: No date: Asthma 08/04/2020: Fibroids  Reproductive/Obstetrics (+) Pregnancy Ectopic pregnancy                             Anesthesia Physical Anesthesia Plan  ASA: 2  Anesthesia Plan: General   Post-op Pain Management:    Induction: Intravenous  PONV Risk Score and Plan: 3 and Ondansetron, Dexamethasone, Midazolam and Treatment may vary due to age or medical condition  Airway Management Planned: Oral ETT  Additional Equipment: None  Intra-op Plan:   Post-operative Plan: Extubation in OR  Informed Consent: I have reviewed the patients History and Physical,  chart, labs and discussed the procedure including the risks, benefits and alternatives for the proposed anesthesia with the patient or authorized representative who has indicated his/her understanding and acceptance.     Dental advisory given  Plan Discussed with: CRNA and Surgeon  Anesthesia Plan Comments: (Discussed risks of anesthesia with patient, including PONV, sore throat, lip/dental damage. Rare risks discussed as well, such as cardiorespiratory and neurological sequelae, and allergic reactions. Patient understands. Patient counseled on benefits of smoking cessation, and increased perioperative risks associated with continued smoking. )        Anesthesia Quick Evaluation

## 2020-12-12 NOTE — H&P (Signed)
Brenda Aguirre is an 25 y.o. female.   Chief Complaint: Abdominal pain in pregnancy HPI: Patient presented to the emergency room today with complaints of abdominal pain.  She reports that she is currently pregnant.  She found out about her pregnancy in August when she had a missed menstrual cycle.  She reports that she has not yet had pregnancy care but was working to obtain it.  She has a history of a prior ectopic pregnancy which was treated with left salpingectomy in May.  Today her beta hCG was elevated at 30,978.  She reports that she is not having vaginal bleeding.   Past Medical History:  Diagnosis Date   Asthma    Fibroids 08/04/2020    Past Surgical History:  Procedure Laterality Date   DIAGNOSTIC LAPAROSCOPY WITH REMOVAL OF ECTOPIC PREGNANCY Left 08/20/2020   Procedure: DIAGNOSTIC LAPAROSCOPY WITH REMOVAL OF ECTOPIC PREGNANCY; SALPINGECTOMY;  Surgeon: Rubie Maid, MD;  Location: [facility] ORS;  Service: Gynecology;  Laterality: Left;   NO PAST SURGERIES      Family History  Problem Relation Age of Onset   Hypertension Maternal Grandmother    Healthy Mother    Healthy Father    Social History:  reports that she has been smoking cigars, e-cigarettes, and cigarettes. She has never used smokeless tobacco. She reports that she does not currently use alcohol after a past usage of about 7.0 standard drinks per week. She reports current drug use. Frequency: 3.00 times per week. Drug: Marijuana.  Allergies: No Known Allergies  Medications Prior to Admission  Medication Sig Dispense Refill   albuterol (VENTOLIN HFA) 108 (90 Base) MCG/ACT inhaler Inhale 2-4 puffs into the lungs every 4 (four) hours as needed for wheezing or shortness of breath. 6.7 g 2   Prenatal Vit-Fe Fumarate-FA (MULTIVITAMIN-PRENATAL) 27-0.8 MG TABS tablet Take 1 tablet by mouth daily at 12 noon. 100 tablet 0   acetaminophen (TYLENOL) 500 MG tablet Take 2 tablets (1,000 mg total) by mouth every 6 (six) hours as  needed for mild pain. (Patient not taking: Reported on 12/03/2020) 40 tablet 1   albuterol (PROVENTIL) (2.5 MG/3ML) 0.083% nebulizer solution Take 3 mLs (2.5 mg total) by nebulization every 6 (six) hours as needed for wheezing or shortness of breath. 75 mL 12   docusate sodium (COLACE) 100 MG capsule Take 1 capsule (100 mg total) by mouth 2 (two) times daily as needed. (Patient not taking: Reported on 10/25/2020) 30 capsule 2   ibuprofen (ADVIL) 800 MG tablet Take 1 tablet (800 mg total) by mouth every 8 (eight) hours as needed. (Patient not taking: Reported on 12/03/2020) 40 tablet 1   simethicone (GAS-X) 80 MG chewable tablet Chew 1 tablet (80 mg total) by mouth 4 (four) times daily as needed for flatulence (bloating). (Patient not taking: Reported on 10/25/2020) 60 tablet 1    Results for orders placed or performed during the hospital encounter of 12/12/20 (from the past 48 hour(s))  Lipase, blood     Status: None   Collection Time: 12/12/20 12:58 PM  Result Value Ref Range   Lipase 31 11 - 51 U/L    Comment: Performed at [facility], [address] [place], [place] [address]  Comprehensive metabolic panel     Status: Abnormal   Collection Time: 12/12/20 12:58 PM  Result Value Ref Range   Sodium 135 135 - 145 mmol/L   Potassium 3.4 (L) 3.5 - 5.1 mmol/L   Chloride 105 98 - 111 mmol/L  CO2 24 22 - 32 mmol/L   Glucose, Bld 118 (H) 70 - 99 mg/dL    Comment: Glucose reference range applies only to samples taken after fasting for at least 8 hours.   BUN 7 6 - 20 mg/dL   Creatinine, Ser 0.58 0.44 - 1.00 mg/dL   Calcium 9.1 8.9 - 10.3 mg/dL   Total Protein 7.2 6.5 - 8.1 g/dL   Albumin 4.0 3.5 - 5.0 g/dL   AST 17 15 - 41 U/L   ALT 14 0 - 44 U/L   Alkaline Phosphatase 39 38 - 126 U/L   Total Bilirubin 0.8 0.3 - 1.2 mg/dL   GFR, Estimated >60 >60 mL/min    Comment: (NOTE) Calculated using the CKD-EPI Creatinine Equation (2021)    Anion gap 6 5 - 15    Comment: Performed at  [facility], [address] [place], [place] [address]  CBC     Status: Abnormal   Collection Time: 12/12/20 12:58 PM  Result Value Ref Range   WBC 4.8 4.0 - 10.5 K/uL   RBC 4.60 3.87 - 5.11 MIL/uL   Hemoglobin 12.4 12.0 - 15.0 g/dL   HCT 33.4 (L) 36.0 - 46.0 %   MCV 72.6 (L) 80.0 - 100.0 fL   MCH 27.0 26.0 - 34.0 pg   MCHC 37.1 (H) 30.0 - 36.0 g/dL   RDW 19.7 (H) 11.5 - 15.5 %   Platelets 289 150 - 400 K/uL   nRBC 0.0 0.0 - 0.2 %    Comment: Performed at [facility], [address] [place], [place] [address]  hCG, quantitative, pregnancy     Status: Abnormal   Collection Time: 12/12/20 12:58 PM  Result Value Ref Range   hCG, Beta Chain, Quant, S 30,978 (H) <5 mIU/mL    Comment:          GEST. AGE      CONC.  (mIU/mL)   <=1 WEEK        5 - 50     2 WEEKS       50 - 500     3 WEEKS       100 - 10,000     4 WEEKS     1,000 - 30,000     5 WEEKS     3,500 - 115,000   6-8 WEEKS     12,000 - 270,000    12 WEEKS     15,000 - 220,000        FEMALE AND NON-PREGNANT FEMALE:     LESS THAN 5 mIU/mL Performed at [facility], [address] [place], [place] [address]   Urinalysis, Complete w Microscopic Urine, Clean Catch     Status: Abnormal   Collection Time: 12/12/20  1:02 PM  Result Value Ref Range   Color, Urine YELLOW (A) YELLOW   APPearance CLEAR (A) CLEAR   Specific Gravity, Urine 1.025 1.005 - 1.030   pH 5.5 5.0 - 8.0   Glucose, UA NEGATIVE NEGATIVE mg/dL   Hgb urine dipstick NEGATIVE NEGATIVE   Bilirubin Urine NEGATIVE NEGATIVE   Ketones, ur 15 (A) NEGATIVE mg/dL   Protein, ur NEGATIVE NEGATIVE mg/dL   Nitrite NEGATIVE NEGATIVE   Leukocytes,Ua NEGATIVE NEGATIVE   RBC / HPF 0-5 0 - 5 RBC/hpf   WBC, UA 0-5 0 - 5 WBC/hpf   Bacteria, UA RARE (A) NONE SEEN   Squamous Epithelial / LPF 6-10 0 - 5   Mucus PRESENT  Comment: Performed at [facility], [address] [place], [place] [address]  Type and screen [place]     Status: None   Collection Time: 12/12/20  4:04 PM  Result Value Ref Range   ABO/RH(D) A NEG    Antibody Screen NEG    Sample Expiration      12/15/2020,2359 Performed at [facility], [address] [place], [place] [address]   Resp Panel by RT-PCR (Flu A&B, Covid) Nasopharyngeal Swab     Status: None   Collection Time: 12/12/20  4:04 PM   Specimen: Nasopharyngeal Swab; Nasopharyngeal(NP) swabs in vial transport medium  Result Value Ref Range   SARS Coronavirus 2 by RT PCR NEGATIVE NEGATIVE    Comment: (NOTE) SARS-CoV-2 target nucleic acids are NOT DETECTED.  The SARS-CoV-2 RNA is generally detectable in upper respiratory specimens during the acute phase of infection. The lowest concentration of SARS-CoV-2 viral copies this assay can detect is 138 copies/mL. A negative result does not preclude SARS-Cov-2 infection and should not be used as the sole basis for treatment or other patient management decisions. A negative result may occur with  improper specimen collection/handling, submission of specimen other than nasopharyngeal swab, presence of viral mutation(s) within the areas targeted by this assay, and inadequate number of viral copies(<138 copies/mL). A negative result must be combined with clinical observations, patient history, and epidemiological information. The expected result is Negative.  Fact Sheet for Patients:  EntrepreneurPulse.com.au  Fact Sheet for Healthcare Providers:  IncredibleEmployment.be  This test is no t yet approved or cleared by the [place] FDA and  has been authorized for detection and/or diagnosis of SARS-CoV-2 by FDA under an Emergency Use Authorization (EUA). This EUA will remain  in effect (meaning this test can be used) for the duration of the COVID-19 declaration under Section 564(b)(1) of the Act, 21 U.S.C.section 360bbb-3(b)(1), unless the authorization is terminated   or revoked sooner.       Influenza A by PCR NEGATIVE NEGATIVE   Influenza B by PCR NEGATIVE NEGATIVE    Comment: (NOTE) The Xpert Xpress SARS-CoV-2/FLU/RSV plus assay is intended as an aid in the diagnosis of influenza from Nasopharyngeal swab specimens and should not be used as a sole basis for treatment. Nasal washings and aspirates are unacceptable for Xpert Xpress SARS-CoV-2/FLU/RSV testing.  Fact Sheet for Patients: EntrepreneurPulse.com.au  Fact Sheet for Healthcare Providers: IncredibleEmployment.be  This test is not yet approved or cleared by the [place] FDA and has been authorized for detection and/or diagnosis of SARS-CoV-2 by FDA under an Emergency Use Authorization (EUA). This EUA will remain in effect (meaning this test can be used) for the duration of the COVID-19 declaration under Section 564(b)(1) of the Act, 21 U.S.C. section 360bbb-3(b)(1), unless the authorization is terminated or revoked.  Performed at [facility], [address] [place], [place] [address]   Rhogam injection     Status: None (Preliminary result)   Collection Time: 12/12/20  4:30 PM  Result Value Ref Range   Unit Number [id number]    Blood Component Type RHIG    Unit division 00    Status of Unit ISSUED    Transfusion Status      OK TO TRANSFUSE Performed at [facility], [address] [place],  [address]    [place] Comp Less 14 Wks  Addendum Date: 12/12/2020   ADDENDUM REPORT: 12/12/2020 15:37 ADDENDUM: Findings of potential peripheral cornual/intramural involvement from the RIGHT  ectopic were discussed with Dr. Acquanetta Belling via telephone, by me for clarification. Emergent OBGYN consultation was suggested. Electronically Signed   By: Zetta Bills M.D.   On: 12/12/2020 15:37   Result Date: 12/12/2020 CLINICAL DATA:  Pelvic pain, history of prior ectopic pregnancy with a quantitative beta HCG of 30,978. EXAM:  OBSTETRIC <14 WK ULTRASOUND TECHNIQUE: Transabdominal ultrasound was performed for evaluation of the gestation as well as the maternal uterus and adnexal regions. COMPARISON:  Prior imaging from Aug 20, 2020. FINDINGS: Intrauterine gestational sac: None **Extra uterine gestational sac: Single ectopic pregnancy visualized ** Yolk sac:  Visualized. Embryo:  Visualized Cardiac Activity: Visualized Heart Rate: 122 bpm CRL:   9 mm   6 w 6 d Maternal uterus/adnexae: Moderate volume of fluid in the pelvis. Fluid about the inferior tip of the RIGHT hemi liver. Some of the fluid in the pelvis shows echogenic material compatible with blood. RIGHT ovary with normal appearance. LEFT ovary with normal appearance. 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 IMPRESSION: Findings of tubal ectopic pregnancy in the RIGHT adnexa immediately adjacent to the uterine fundus, potentially some involvement of the peripheral intramural/cornual portion of the fallopian tube. Findings associated with moderate fluid in the pelvis and tracking along the RIGHT hemi liver with signs of potential hemoperitoneum. Findings are suspicious for impending rupture of ectopic pregnancy. Critical Value/emergent results were called by telephone at the time of interpretation on 12/12/2020 at 3:23 pm to provider PHILLIP STAFFORD , who verbally acknowledged these results. Electronically Signed: By: Zetta Bills M.D. On: 12/12/2020 15:26    Review of Systems  Constitutional:  Negative for chills and fever.  HENT:  Negative for congestion, hearing loss and sinus pain.   Respiratory:  Negative for cough, shortness of breath and wheezing.   Cardiovascular:  Negative for chest pain, palpitations and leg swelling.  Gastrointestinal:  Negative for abdominal pain, constipation, diarrhea, nausea and vomiting.  Genitourinary:  Negative for dysuria, flank pain, frequency, hematuria and urgency.  Musculoskeletal:  Negative for back pain.  Skin:   Negative for rash.  Neurological:  Negative for dizziness and headaches.  Psychiatric/Behavioral:  Negative for suicidal ideas. The patient is not nervous/anxious.    Blood pressure 105/69, pulse 62, temperature 98.3 F (36.8 C), temperature source Oral, resp. rate 16, height 5' (1.524 m), weight 66.7 kg, last menstrual period 10/18/2020, SpO2 100 %. Physical Exam Vitals and nursing note reviewed.  Constitutional:      Appearance: Normal appearance. She is well-developed.  HENT:     Head: Normocephalic and atraumatic.  Cardiovascular:     Rate and Rhythm: Normal rate and regular rhythm.  Pulmonary:     Effort: Pulmonary effort is normal.     Breath sounds: Normal breath sounds.  Abdominal:     General: Bowel sounds are normal.     Palpations: Abdomen is soft.     Tenderness: There is generalized abdominal tenderness.  Musculoskeletal:        General: Normal range of motion.  Skin:    General: Skin is warm and dry.  Neurological:     Mental Status: She is alert and oriented to person, place, and time.  Psychiatric:        Behavior: Behavior normal.        Thought Content: Thought content normal.        Judgment: Judgment normal.     Assessment/Plan 25 year old [id number] 51w6dwith ectopic pregnancy.  Right-sided possibly cornual pregnancy Discussed the ectopic pregnancy  with the patient.  Discussed that since her beta-hCG is significantly elevated and a fetal heart rate is present with the ectopic pregnancy there is a low likelihood of success of methotrexate.  There is also significant concern of evolving rupture based off of today's pelvic ultrasound.  The patient is currently hemodynamically stable with a stable hemoglobin.  Discussed that the definitive location of the ectopic pregnancy will be diagnosed intraoperatively however given the ultrasound appearance it is likely cortical and involving the right fallopian tube.  Discussed the possibility of the right fallopian tube need  to be removed and the possibility of a wedge resection to treat the cornual pregnancy.  We discussed that this would prevent her from obtaining future pregnancies without IVF based procedures.  Discussed that a wedge resection of the uterus could result in complications of uterine rupture with her prior pregnancy with a future pregnancy and a cesarean section would be night required.  Discussed the risks of damage to damage to surrounding pelvic organs including the uterus fallopian tubes and ovaries bowel and bladder.  Discussed the risks of bleeding associated with the procedure.  Patient is willing to accept a blood transfusion if needed.  Discussed risks of infection.   Patient consented for this procedure.  Surgical consents were signed and patient was will be taken to surgery shortly.  All of the patient's and her family member's questions were answered.  Homero Fellers, MD 12/12/2020, 5:30 PM

## 2020-12-13 ENCOUNTER — Encounter: Payer: Self-pay | Admitting: Obstetrics and Gynecology

## 2020-12-13 LAB — RHOGAM INJECTION: Unit division: 0

## 2020-12-13 NOTE — Anesthesia Postprocedure Evaluation (Signed)
Anesthesia Post Note  Patient: Brenda Aguirre  Procedure(s) Performed: LAPAROSCOPIC Right salpingectomy (Right: Abdomen)  Patient location during evaluation: PACU Anesthesia Type: General Level of consciousness: awake and alert Pain management: pain level controlled Vital Signs Assessment: post-procedure vital signs reviewed and stable Respiratory status: spontaneous breathing, nonlabored ventilation, respiratory function stable and patient connected to nasal cannula oxygen Cardiovascular status: blood pressure returned to baseline and stable Postop Assessment: no apparent nausea or vomiting Anesthetic complications: no   No notable events documented.   Last Vitals:  Vitals:   12/12/20 2030 12/12/20 2034  BP: (!) 108/58   Pulse: 68 61  Resp: 15 13  Temp:    SpO2: 97% 99%    Last Pain:  Vitals:   12/12/20 2034  TempSrc:   PainSc: 3                  Arita Miss

## 2020-12-14 LAB — SURGICAL PATHOLOGY

## 2020-12-20 ENCOUNTER — Encounter: Payer: Self-pay | Admitting: Obstetrics and Gynecology

## 2020-12-20 ENCOUNTER — Ambulatory Visit (INDEPENDENT_AMBULATORY_CARE_PROVIDER_SITE_OTHER): Payer: No Typology Code available for payment source | Admitting: Obstetrics and Gynecology

## 2020-12-20 ENCOUNTER — Other Ambulatory Visit: Payer: Self-pay

## 2020-12-20 VITALS — BP 118/70 | HR 76 | Ht 61.0 in | Wt 151.8 lb

## 2020-12-20 DIAGNOSIS — Z4889 Encounter for other specified surgical aftercare: Secondary | ICD-10-CM

## 2020-12-20 NOTE — Progress Notes (Signed)
  Postoperative Follow-up Patient presents post op from  Laparoscopic Right salpingectomy   for  ruptured right ectopic pregnancy , 1 week ago.  Subjective: Patient reports marked improvement in her preop symptoms. Eating a regular diet without difficulty. Pain is controlled with current analgesics. Medications being used: ibuprofen (OTC) and narcotic analgesics including oxycodone (Oxycontin, Oxyir).  Activity: normal activities of daily living.  Objective: BP 118/70   Pulse 76   Ht '5\' 1"'$  (1.549 m)   Wt 151 lb 12.8 oz (68.9 kg)   LMP 10/18/2020   BMI 28.68 kg/m  Physical Exam Constitutional:      Appearance: Normal appearance. She is well-developed.  HENT:     Head: Normocephalic and atraumatic.  Eyes:     Extraocular Movements: Extraocular movements intact.     Pupils: Pupils are equal, round, and reactive to light.  Neck:     Thyroid: No thyromegaly.  Cardiovascular:     Rate and Rhythm: Normal rate and regular rhythm.     Heart sounds: Normal heart sounds.  Pulmonary:     Effort: Pulmonary effort is normal.     Breath sounds: Normal breath sounds.  Abdominal:     General: Bowel sounds are normal. There is no distension.     Palpations: Abdomen is soft. There is no mass.     Comments: Incisions are clean dry and intact, healing well.  Musculoskeletal:     Cervical back: Neck supple.  Neurological:     Mental Status: She is alert and oriented to person, place, and time.  Skin:    General: Skin is warm and dry.  Psychiatric:        Behavior: Behavior normal.        Thought Content: Thought content normal.        Judgment: Judgment normal.  Vitals reviewed.    Assessment: s/p :  Laparoscopic right salpingectomy stable  Plan: Patient has done well after surgery with no apparent complications.  I have discussed the post-operative course to date, and the expected progress moving forward.  The patient understands what complications to be concerned about.  I will see the  patient in routine follow up, or sooner if needed.    Activity plan: May return to work. Advance activity as tolerated  We discussed the possibility that she could have endometriosis based off of appearance of lesions in pelvis. Discussed adhesion in upper right quadrant between liver and abdominal wall.  She has a history of uterine fibroids and is considering a myomectomy. She will follow up to discuss further if desired.  She is considering infertility treatments.   Adrian Prows MD, [facility], [facility] 12/20/2020 9:56 AM

## 2020-12-25 ENCOUNTER — Encounter: Payer: Self-pay | Admitting: Obstetrics and Gynecology

## 2021-03-19 ENCOUNTER — Ambulatory Visit: Payer: Self-pay

## 2021-05-14 ENCOUNTER — Ambulatory Visit: Payer: Self-pay

## 2021-05-15 ENCOUNTER — Ambulatory Visit: Payer: Self-pay

## 2021-05-27 LAB — HM HIV SCREENING LAB: HM HIV Screening: NEGATIVE

## 2021-05-29 ENCOUNTER — Ambulatory Visit: Payer: Self-pay

## 2021-05-29 ENCOUNTER — Encounter: Payer: Self-pay | Admitting: Family Medicine

## 2021-05-29 ENCOUNTER — Other Ambulatory Visit: Payer: Self-pay

## 2021-05-29 ENCOUNTER — Ambulatory Visit: Payer: Self-pay | Admitting: Nurse Practitioner

## 2021-05-29 VITALS — BP 100/70 | Ht 61.0 in | Wt 149.2 lb

## 2021-05-29 DIAGNOSIS — Z113 Encounter for screening for infections with a predominantly sexual mode of transmission: Secondary | ICD-10-CM

## 2021-05-29 DIAGNOSIS — B9689 Other specified bacterial agents as the cause of diseases classified elsewhere: Secondary | ICD-10-CM

## 2021-05-29 DIAGNOSIS — N760 Acute vaginitis: Secondary | ICD-10-CM

## 2021-05-29 LAB — WET PREP FOR TRICH, YEAST, CLUE
Trichomonas Exam: NEGATIVE
Yeast Exam: NEGATIVE

## 2021-05-29 LAB — HEPATITIS B SURFACE ANTIGEN: Hepatitis B Surface Ag: NONREACTIVE

## 2021-05-29 MED ORDER — METRONIDAZOLE 500 MG PO TABS: 500.0000 mg | ORAL_TABLET | Freq: Two times a day (BID) | ORAL | 0 refills | Status: AC

## 2021-05-29 NOTE — Progress Notes (Signed)
Pt here for STD screening.  Wet mount results reviewed and medication dispensed.  Windle Guard, RN

## 2021-05-29 NOTE — Progress Notes (Signed)
[facility] Department  STI clinic/screening visit [place] [address] [phone]  Subjective:  Brenda Aguirre is a 26 y.o. female being seen today for an STI screening visit. The patient reports they do not have symptoms.  Patient reports that they do not desire a pregnancy in the next year.   They reported they are not interested in discussing contraception today.    Patient's last menstrual period was 05/17/2021 (exact date).   Patient has the following medical conditions:   Patient Active Problem List   Diagnosis Date Noted   Hemoperitoneum due to rupture of right tubal ectopic pregnancy 08/04/2020   Vaginal bleeding affecting early pregnancy 08/04/2020   Uterine fibroid 08/04/2020   Rh negative state in antepartum period, first trimester 08/04/2020   Cigar smoker 2/day + vapes 02/21/2020   Marijuana use 02/21/2020   Syphilis 02/07/2019   Overweight 01/06/2018    Chief Complaint  Patient presents with   SEXUALLY TRANSMITTED DISEASE    HPI  Patient reports to clinic today for STD screening.  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    Last HIV test per patient/review of record was 06/02/2019 Patient reports last pap was 11/25/2017.   Screening for MPX risk: Does the patient have an unexplained rash? No Is the patient MSM? No Does the patient endorse multiple sex partners or anonymous sex partners? No Did the patient have close or sexual contact with a person diagnosed with MPX? No Has the patient traveled outside the [place] where MPX is endemic? No Is there a high clinical suspicion for MPX-- evidenced by one of the following No  -Unlikely to be chickenpox  -Lymphadenopathy  -Rash that present in same phase of evolution on any given body part See flowsheet for further details and programmatic requirements.    The following portions of the patient's history were reviewed and updated as appropriate:  allergies, current medications, past medical history, past social history, past surgical history and problem list.  Objective:   Vitals:   05/29/21 1410  BP: 100/70  Weight: 149 lb 3.2 oz (67.7 kg)  Height: 5\' 1"  (1.549 m)    Physical Exam Constitutional:      Appearance: Normal appearance.  HENT:     Head: Normocephalic.     Right Ear: External ear normal.     Left Ear: External ear normal.     Nose: Nose normal.     Mouth/Throat:     Mouth: Mucous membranes are moist.     Comments: No visible signs of dental caries.  Last dental exam a year ago.  Eyes:     Pupils: Pupils are equal, round, and reactive to light.  Pulmonary:     Effort: Pulmonary effort is normal.  Abdominal:     General: Abdomen is flat.     Palpations: Abdomen is soft.  Genitourinary:    Comments: External genitalia/pubic area without nits, lice, edema, erythema, lesions and inguinal adenopathy. Vagina with normal mucosa and discharge. Cervix without visible lesions. Uterus firm, mobile, nt, no masses, no CMT, no adnexal tenderness or fullness. pH 5.5. Musculoskeletal:     Cervical back: Full passive range of motion without pain, normal range of motion and neck supple.  Skin:    General: Skin is warm and dry.  Neurological:     Mental Status: She is alert and oriented to person, place, and time.  Psychiatric:  Attention and Perception: Attention normal.        Mood and Affect: Mood normal.        Speech: Speech normal.        Behavior: Behavior is cooperative.     Assessment and Plan:  Brenda Aguirre is a 26 y.o. female presenting to the [facility] for STI screening  1. Screening for venereal disease -26 year old female in for STD screening. -Patient accepted all screenings including oral, vaginal CT/GC and bloodwork for HIV/RPR.  Patient meets criteria for HepB screening? Yes. Ordered? Yes Patient meets criteria for HepC screening? Yes. Ordered? Yes  Treat wet  prep per standing order Discussed time line for State [facility] results and that patient will be called with positive results and encouraged patient to call if she had not heard in 2 weeks.  Counseled to return or seek care for continued or worsening symptoms Recommended condom use with all sex  Patient is currently using Sterilization by Laparoscopy to prevent pregnancy.    - Chlamydia/Gonorrhea [facility] - HIV/HCV [facility] - Syphilis Serology, [place] [facility] - HBV Antigen/Antibody [facility] - WET PREP FOR [place], YEAST, CLUE - Gonococcus culture   2. Bacterial vaginosis -Wet mount reviewed, please treat patient for BV.  - metroNIDAZOLE (FLAGYL) 500 MG tablet; Take 1 tablet (500 mg total) by mouth 2 (two) times daily for 7 days.  Dispense: 14 tablet; Refill: 0   Return if symptoms worsen or fail to improve.    Gregary Cromer, FNP

## 2021-06-03 LAB — GONOCOCCUS CULTURE

## 2021-06-10 ENCOUNTER — Telehealth: Payer: Self-pay

## 2021-06-10 NOTE — Telephone Encounter (Signed)
Brenda Aguirre, DIS, informed of 05/29/21 syphilis result of 1:128.  Brenda Aguirre will be contacting patient. ? ?Brenda Cromer, FNP also informed of result. ?

## 2021-06-12 NOTE — Telephone Encounter (Signed)
Brenda Aguirre called with update. Brenda Aguirre talked with pt briefly and then phone was disconnected. Brenda Aguirre contacted her by text and informed pt that they needed to complete their call. Pt not schedule for tx at this time. ?

## 2021-06-13 NOTE — Telephone Encounter (Addendum)
Received phone call from [place], state DIS  She was able to make contact with pt again.  Pt scheduled for 06/17/21 syphilis tx. ? ?Per Donnal Moat, CNM, pt needs Bicillin LA 2.4 MU x3.  06/17/21 will be first tx. ? ?Consulted on the plan of care for this client.  I agree with the documented note and actions taken to provide care for this client.  Ola Spurr, CNM ? ?

## 2021-06-17 ENCOUNTER — Other Ambulatory Visit: Payer: Self-pay

## 2021-06-17 ENCOUNTER — Ambulatory Visit: Payer: Self-pay | Admitting: Family Medicine

## 2021-06-17 DIAGNOSIS — A539 Syphilis, unspecified: Secondary | ICD-10-CM

## 2021-06-17 MED ORDER — PENICILLIN G BENZATHINE 1200000 UNIT/2ML IM SUSY
2.4000 10*6.[IU] | PREFILLED_SYRINGE | INTRAMUSCULAR | Status: AC
Start: 2021-06-17 — End: 2021-07-08
  Administered 2021-06-17 – 2021-06-26 (×2): 2.4 10*6.[IU] via INTRAMUSCULAR

## 2021-06-17 NOTE — Progress Notes (Signed)
S: Pt in clinic for Syphilis  treatment    ? ?O: RPR 1:128 ? ?A: 1. Syphilis ?- penicillin g benzathine (BICILLIN LA) 1200000 UNIT/2ML injection 2.4 Million Units weekly for 3 weeks.  ? ? ?P:  Discussed with patient disease process, and treatment and post treatment instructions  ?Pt to schedule for 1 week for next treatment  ?Pt treated by RN, see note.   ?Pt tolerated well and monitored for 15 mins.  ?Pt to call if any questions or concerns.   ? ?Junious Dresser, FNP ? ?

## 2021-06-17 NOTE — Progress Notes (Signed)
Patient here for Syphilis tx. Medication dispensed. Patient instructed to stay in clinic for 15 minutes post injection. Patient verbalized understanding.  ?

## 2021-06-26 ENCOUNTER — Ambulatory Visit: Payer: Self-pay | Admitting: Family Medicine

## 2021-06-26 ENCOUNTER — Other Ambulatory Visit: Payer: Self-pay

## 2021-06-26 DIAGNOSIS — A539 Syphilis, unspecified: Secondary | ICD-10-CM

## 2021-06-26 NOTE — Progress Notes (Signed)
Pt was not seen by provider ? ? ?Junious Dresser, FNP ? ?

## 2021-06-26 NOTE — Progress Notes (Signed)
Pt here for treatment #2 of Syphilis.  Bicillin 2.4 MU given IM without any complications.  Windle Guard, RN ? ?

## 2021-07-03 ENCOUNTER — Ambulatory Visit: Payer: Self-pay

## 2021-07-05 ENCOUNTER — Ambulatory Visit: Payer: Self-pay

## 2021-07-19 ENCOUNTER — Ambulatory Visit: Payer: Self-pay

## 2021-10-03 ENCOUNTER — Ambulatory Visit: Payer: Self-pay

## 2021-10-28 ENCOUNTER — Telehealth: Payer: Self-pay | Admitting: Obstetrics and Gynecology
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
## 2021-11-06 ENCOUNTER — Ambulatory Visit: Payer: Self-pay

## 2021-11-23 IMAGING — US US OB COMP LESS 14 WK
1 series · 12 of 28 positions shown · non-contrast
Comparison: Prior imaging from August 20, 2020.
COMPARISON: Prior imaging from August 20, 2020.

Addendum:
CLINICAL DATA: Pelvic pain, history of prior ectopic pregnancy with
a quantitative beta HCG of [DATE].

EXAM:
OBSTETRIC <14 WK ULTRASOUND
TECHNIQUE: Transabdominal ultrasound was performed for evaluation of the
gestation as well as the maternal uterus and adnexal regions.

[Series 1: us ob less than 14 weeks with ob transvaginal · 59 acquisitions, 12 frames shown]
[im 3/59]
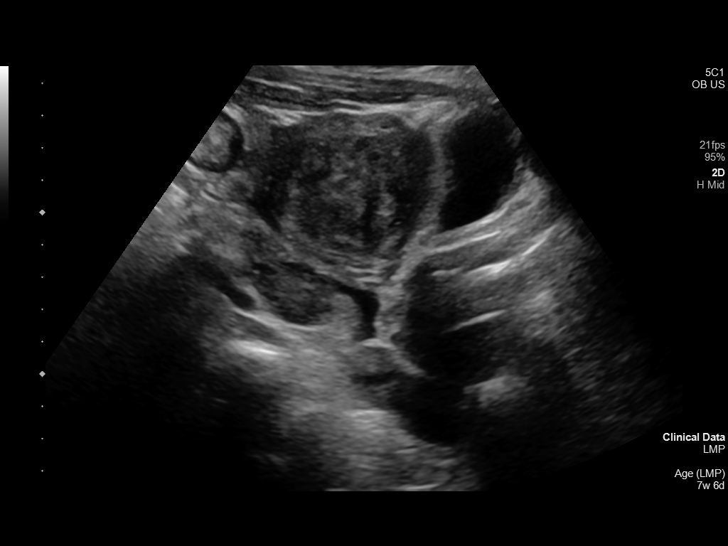
[im 7/59]
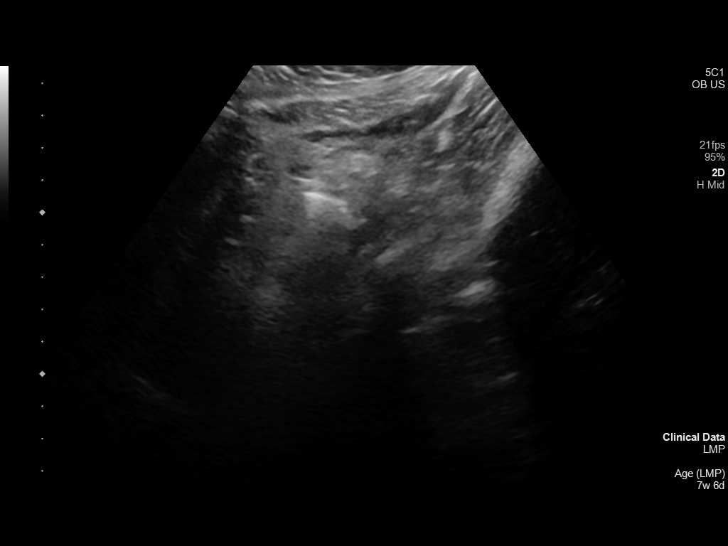
[im 11/59]
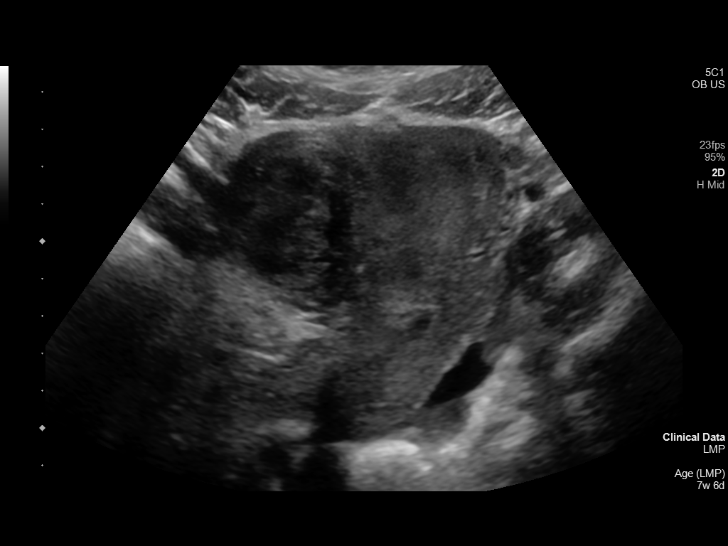
[im 18/59]
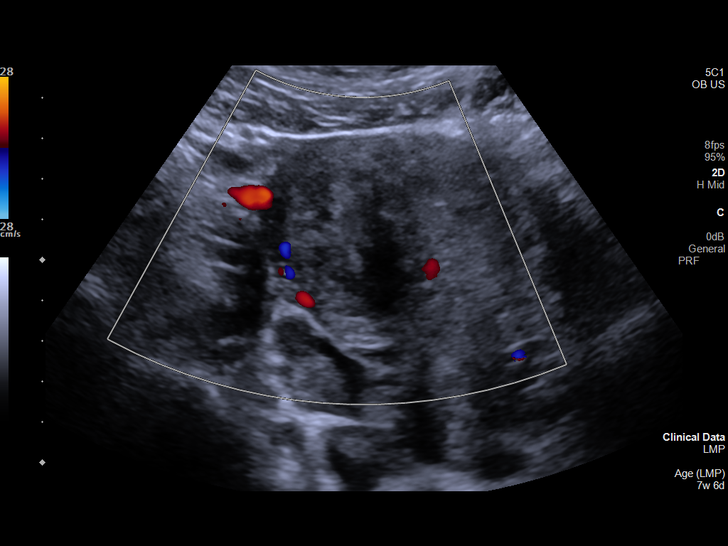
[im 22/59]
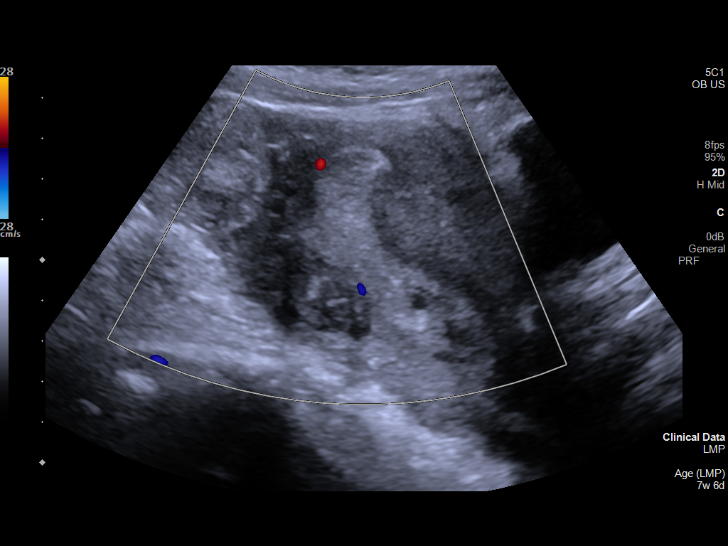
[im 26/59]
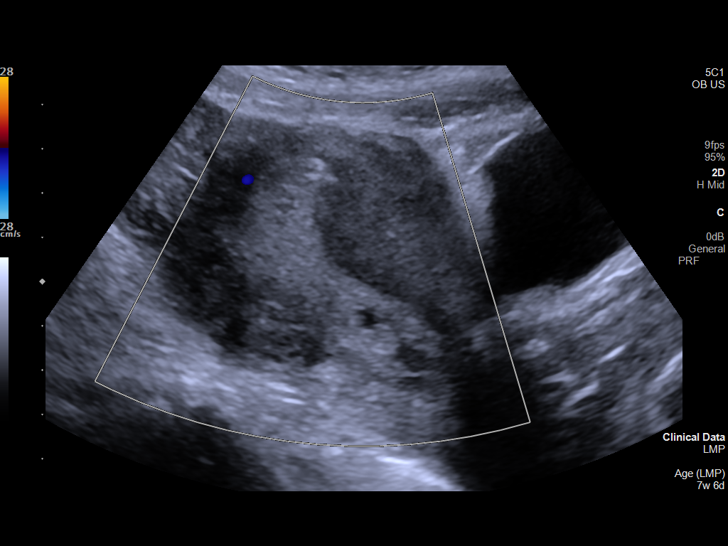
[im 33/59]
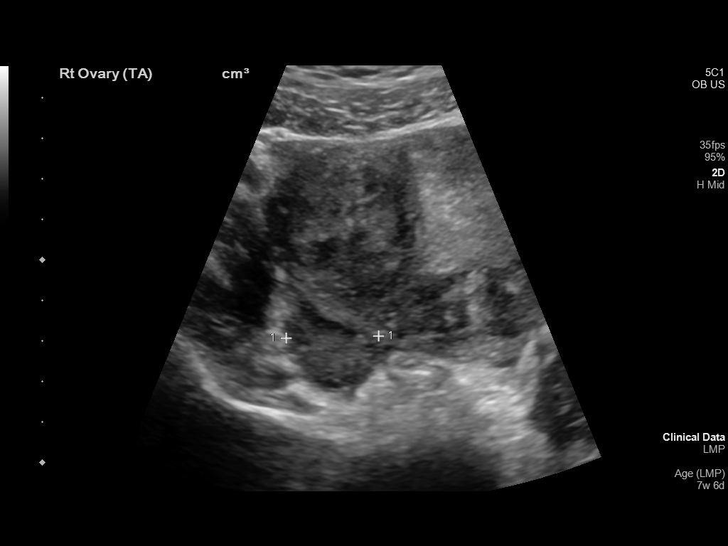
[im 37/59]
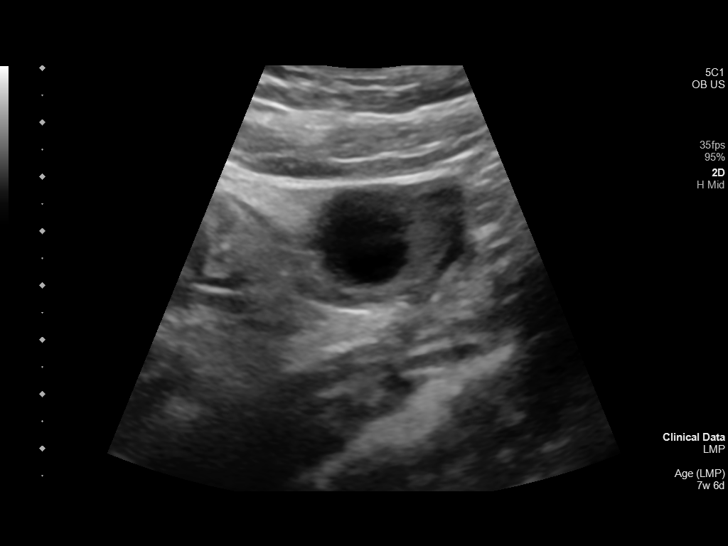
[im 41/59]
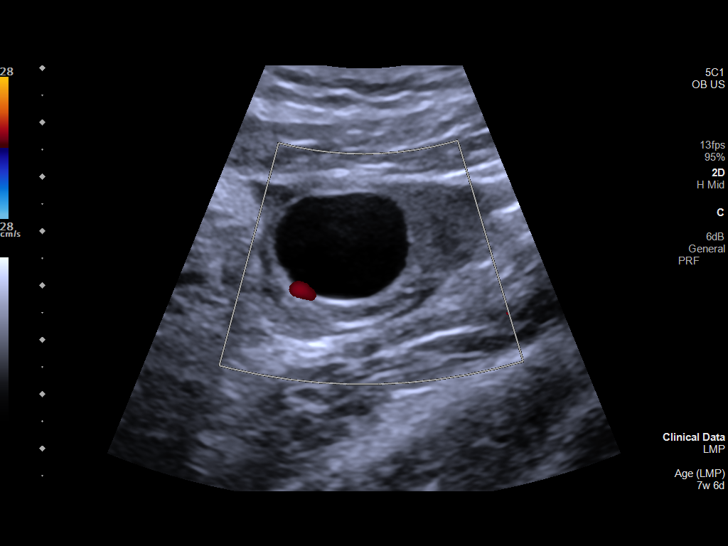
[im 48/59]
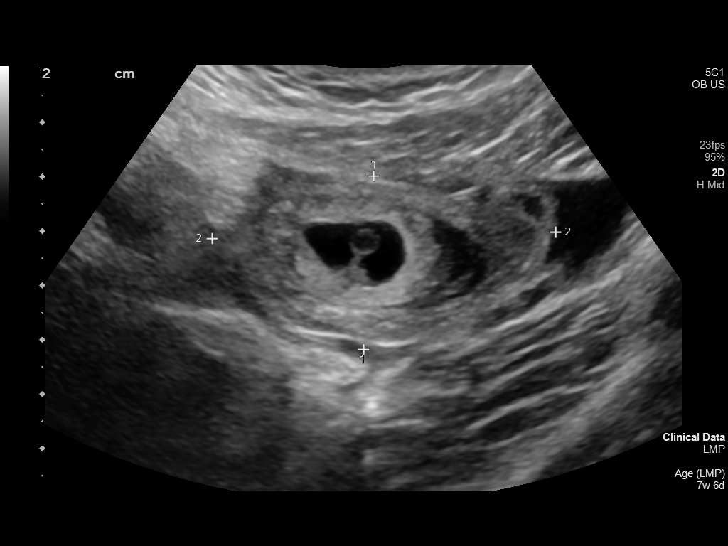
[im 52/59]
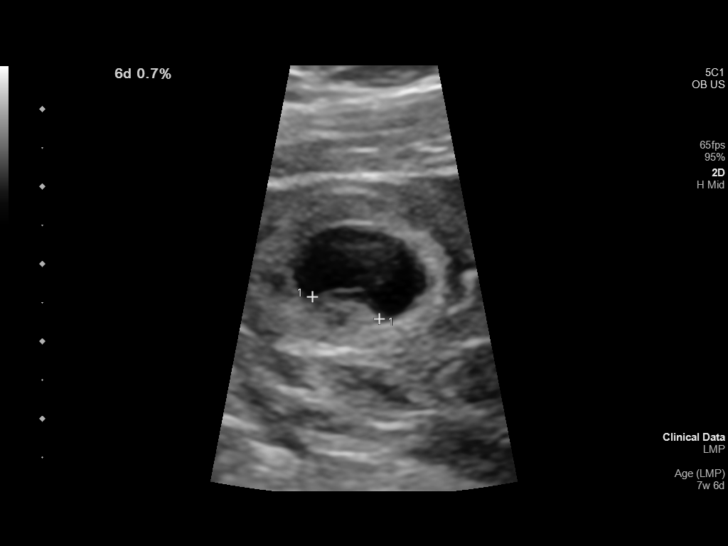
[im 56/59]
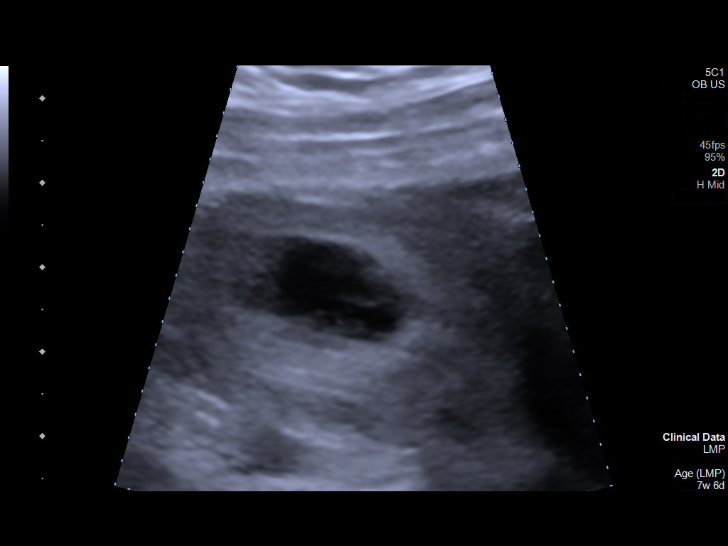

[12 of 28 positions shown; findings below may reference images not displayed]

FINDINGS: Intrauterine gestational sac: None

***Extra uterine gestational sac: Single ectopic pregnancy
visualized ***

Yolk sac:  Visualized.

Embryo:  Visualized

Cardiac Activity: Visualized

Heart Rate: 122 bpm

CRL:   9 mm   6 w 6 d

Maternal uterus/adnexae: Moderate volume of fluid in the pelvis.
Fluid about the inferior tip of the RIGHT hemi liver.

Some of the fluid in the pelvis shows echogenic material compatible
with blood.

RIGHT ovary with normal appearance.

LEFT ovary with normal appearance.

Leiomyoma in the uterus and markedly thickened endometrium.
IMPRESSION: Findings of tubal ectopic pregnancy in the RIGHT adnexa immediately
adjacent to the uterine fundus, potentially some involvement of the
peripheral intramural/cornual portion of the fallopian tube.
Findings associated with moderate fluid in the pelvis and tracking
along the RIGHT hemi liver with signs of potential hemoperitoneum.
Findings are suspicious for impending rupture of ectopic pregnancy.

Critical Value/emergent results were called by telephone at the time
of interpretation on 12/12/2020 at [DATE] to provider MOOLMAN
TRIPTI , who verbally acknowledged these results.

ADDENDUM:
Findings of potential peripheral cornual/intramural involvement from
the RIGHT ectopic were discussed with Dr. Nya Jumper via
telephone, by me for clarification. Emergent OBGYN consultation was
suggested.

*** End of Addendum ***
FINDINGS: Intrauterine gestational sac: None

***Extra uterine gestational sac: Single ectopic pregnancy
visualized ***

Yolk sac:  Visualized.

Embryo:  Visualized

Cardiac Activity: Visualized

Heart Rate: 122 bpm

CRL:   9 mm   6 w 6 d

Maternal uterus/adnexae: Moderate volume of fluid in the pelvis.
Fluid about the inferior tip of the RIGHT hemi liver.

Some of the fluid in the pelvis shows echogenic material compatible
with blood.

RIGHT ovary with normal appearance.

LEFT ovary with normal appearance.

Leiomyoma in the uterus and markedly thickened endometrium.
IMPRESSION: Findings of tubal ectopic pregnancy in the RIGHT adnexa immediately
adjacent to the uterine fundus, potentially some involvement of the
peripheral intramural/cornual portion of the fallopian tube.
Findings associated with moderate fluid in the pelvis and tracking
along the RIGHT hemi liver with signs of potential hemoperitoneum.
Findings are suspicious for impending rupture of ectopic pregnancy.

Critical Value/emergent results were called by telephone at the time
of interpretation on 12/12/2020 at [DATE] to provider MOOLMAN
TRIPTI , who verbally acknowledged these results.

## 2022-01-01 ENCOUNTER — Encounter: Payer: Self-pay | Admitting: Obstetrics and Gynecology

## 2022-01-15 ENCOUNTER — Encounter: Payer: Self-pay | Admitting: Obstetrics and Gynecology
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
## 2022-02-03 ENCOUNTER — Ambulatory Visit: Payer: Self-pay

## 2022-02-05 ENCOUNTER — Ambulatory Visit: Payer: Self-pay | Admitting: Nurse Practitioner

## 2022-02-05 DIAGNOSIS — Z113 Encounter for screening for infections with a predominantly sexual mode of transmission: Secondary | ICD-10-CM

## 2022-02-05 LAB — HM HIV SCREENING LAB: HM HIV Screening: NEGATIVE

## 2022-02-05 LAB — HM HEPATITIS C SCREENING LAB: HM Hepatitis Screen: NEGATIVE

## 2022-02-05 NOTE — Progress Notes (Signed)
[facility] Department  STI clinic/screening visit [place] [address] [phone]  Subjective:  Brenda Aguirre is a 26 y.o. female being seen today for an STI screening visit. The patient reports they do have symptoms.  Patient reports that they do desire a pregnancy in the next year.   They reported they are not interested in discussing contraception today.    Patient's last menstrual period was 01/30/2022 (exact date).   Patient has the following medical conditions:   Patient Active Problem List   Diagnosis Date Noted  . Hemoperitoneum due to rupture of right tubal ectopic pregnancy 08/04/2020  . Vaginal bleeding affecting early pregnancy 08/04/2020  . Uterine fibroid 08/04/2020  . Rh negative state in antepartum period, first trimester 08/04/2020  . Cigar smoker 2/day + vapes 02/21/2020  . Marijuana use 02/21/2020  . Syphilis 02/07/2019  . Overweight 01/06/2018    Chief Complaint  Patient presents with  . SEXUALLY TRANSMITTED DISEASE    Requesting Syphilis bloodwork to check titers, and for HPV check    HPI  Patient reports to clinic for RPR follow up.  Patient has a history of Syphilis, patient was treated 06/2021.  Patient states she would also like STD screening due to an odor that she has had since her period ended, concern about a retained tampon.    Does the patient using douching products? No  Last HIV test per patient/review of record was  Lab Results  Component Value Date   HMHIVSCREEN Negative - Validated 05/27/2021    Lab Results  Component Value Date   HIV Non Reactive 02/14/2018   HIV NON REACTIVE 02/14/2018   Patient reports last pap was: 2019  Screening for MPX risk: Does the patient have an unexplained rash? No Is the patient MSM? No Does the patient endorse multiple sex partners or anonymous sex partners? No Did the patient have close or sexual contact with a person diagnosed with MPX? No Has the  patient traveled outside the [place] where MPX is endemic? No Is there a high clinical suspicion for MPX-- evidenced by one of the following No  -Unlikely to be chickenpox  -Lymphadenopathy  -Rash that present in same phase of evolution on any given body part See flowsheet for further details and programmatic requirements.   Immunization history:  Immunization History  Administered Date(s) Administered  . Hepatitis B 1994/05/[birth year], 11/26/1994, 05/01/1995  . Hepatitis B, adult Nov 16, 1995  . MMR 11/04/1995, 10/17/1999  . Moderna Sars-Covid-2 Vaccination 11/30/2019     The following portions of the patient's history were reviewed and updated as appropriate: allergies, current medications, past medical history, past social history, past surgical history and problem list.  Objective:  There were no vitals filed for this visit.  Physical Exam Constitutional:      Appearance: Normal appearance.  HENT:     Head: Normocephalic. No abrasion, masses or laceration. Hair is normal.     Right Ear: External ear normal.     Left Ear: External ear normal.     Nose: Nose normal.     Mouth/Throat:     Lips: Pink.     Mouth: Mucous membranes are moist. No oral lesions.     Pharynx: No oropharyngeal exudate or posterior oropharyngeal erythema.     Tonsils: No tonsillar exudate or tonsillar abscesses.  Eyes:     General: Lids are normal.        Right eye: No discharge.  Left eye: No discharge.     Conjunctiva/sclera: Conjunctivae normal.     Right eye: No exudate.    Left eye: No exudate. Abdominal:     General: Abdomen is flat.     Palpations: Abdomen is soft.     Tenderness: There is no abdominal tenderness. There is no rebound.  Genitourinary:    Pubic Area: No rash or pubic lice.      Labia:        Right: No rash, tenderness, lesion or injury.        Left: No rash, tenderness, lesion or injury.      Vagina: Normal. No vaginal discharge, erythema or lesions.     Cervix: No cervical  motion tenderness, discharge, lesion or erythema.     Uterus: Not enlarged and not tender.      Rectum: Normal.     Comments: Amount Discharge: small  Odor: Yes pH: greater than 4.5, patient spotting  Adheres to vaginal wall: No Color: dark red, menses ending   Musculoskeletal:     Cervical back: Full passive range of motion without pain, normal range of motion and neck supple.  Lymphadenopathy:     Cervical: No cervical adenopathy.     Right cervical: No superficial, deep or posterior cervical adenopathy.    Left cervical: No superficial, deep or posterior cervical adenopathy.     Upper Body:     Right upper body: No supraclavicular, axillary or epitrochlear adenopathy.     Left upper body: No supraclavicular, axillary or epitrochlear adenopathy.     Lower Body: No right inguinal adenopathy. No left inguinal adenopathy.  Skin:    General: Skin is warm and dry.     Findings: No lesion or rash.  Neurological:     Mental Status: She is alert and oriented to person, place, and time.  Psychiatric:        Attention and Perception: Attention normal.        Mood and Affect: Mood normal.        Speech: Speech normal.        Behavior: Behavior normal. Behavior is cooperative.     Assessment and Plan:  Brenda Aguirre is a 26 y.o. female presenting to the [facility] Department for STI screening  1. Screening examination for venereal disease -26 year old female in clinic for STD screening and RPR follow up.  -Patient accepted all screenings including oral, vaginal, rectal CT/GC and bloodwork for HIV/RPR.  Patient meets criteria for HepB screening? No. Ordered? No - low risk  Patient meets criteria for HepC screening? Yes. Ordered? Yes  Treat wet prep per standing order Discussed time line for State [facility] results and that patient will be called with positive results and encouraged patient to call if she had not heard in 2 weeks.  Counseled to return or seek care for  continued or worsening symptoms Recommended condom use with all sex  Patient is currently not using  contraception  to prevent pregnancy.    - HIV/HCV [facility] - Syphilis Serology,  Lab - [place], YEAST, CLUE   Total time spent: 30 minutes   Return if symptoms worsen or fail to improve.    Gregary Cromer, FNP

## 2022-02-05 NOTE — Progress Notes (Unsigned)
Pt appointment for STI screening. Seen by FNP White. Initial lab results reviewed with pt.

## 2022-02-05 NOTE — Progress Notes (Signed)
See other note. Gregary Cromer, FNP

## 2022-02-06 LAB — WET PREP FOR TRICH, YEAST, CLUE
Trichomonas Exam: NEGATIVE
Yeast Exam: NEGATIVE

## 2022-02-19 ENCOUNTER — Telehealth: Payer: Self-pay

## 2022-02-19 NOTE — Telephone Encounter (Signed)
Calling pt re 02/05/22 syphilis results: RPR = Reactive Titer = 1:128 TP = Reactive  Hx: 1:1 titer on 06/02/19 (prior to this had +RPR and adequate tx). 1:128 on 05/29/21, BIC x3 ordered. Pt only came in for BICx2: 06/17/21 and 06/26/21. Has cancelled and missed several appts.  Current titer still 1:128.  Phone call to pt at [phone]. Left message on voicemail that RN with [facility] is calling re recent test results. Please call Soloman Mckeithan at [phone].

## 2022-02-21 NOTE — Telephone Encounter (Signed)
Phone call to pt at [phone]. Left message on voicemail that RN with [facility] is calling re recent test results. Please call Katesha Eichel at [phone].

## 2022-02-25 NOTE — Telephone Encounter (Signed)
Phone call to pt at [phone]. Left message on voicemail that RN with [facility] is calling re recent test results. Please call Tunya Held at [phone].  Tried to call again, line was picked up then hung up. Received text from pt's number asking who was calling; responded back by text that I am a nurse with [facility] and had also left a voicemail.  Also sent MyChart message. No other contact numbers listed in pt demographics.  See scanned 02/05/22 TR with orders for pt to restart tx and receive Bicillin x3.
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
## 2022-02-26 NOTE — Telephone Encounter (Signed)
Discussed update with DIS, Brion Aliment.

## 2022-03-12 ENCOUNTER — Ambulatory Visit: Payer: Self-pay | Admitting: Nurse Practitioner

## 2022-03-12 ENCOUNTER — Encounter: Payer: Self-pay | Admitting: Nurse Practitioner

## 2022-03-12 DIAGNOSIS — A539 Syphilis, unspecified: Secondary | ICD-10-CM

## 2022-03-12 MED ORDER — PENICILLIN G BENZATHINE 1200000 UNIT/2ML IM SUSY
2.4000 10*6.[IU] | PREFILLED_SYRINGE | Freq: Once | INTRAMUSCULAR | Status: AC
  Administered 2022-03-12: 2.4 10*6.[IU] via INTRAMUSCULAR

## 2022-03-12 NOTE — Progress Notes (Unsigned)
[facility] Department  STI clinic/screening visit [place] [address] [phone]  Subjective:  Brenda Aguirre is a 26 y.o. female being seen today for an STI screening visit. The patient reports they do not have symptoms.  Patient reports that they do desire a pregnancy in the next year.   They reported they are not interested in discussing contraception today.    Patient's last menstrual period was 03/05/2022 (approximate).   Patient has the following medical conditions:   Patient Active Problem List   Diagnosis Date Noted   Hemoperitoneum due to rupture of right tubal ectopic pregnancy 08/04/2020   Vaginal bleeding affecting early pregnancy 08/04/2020   Uterine fibroid 08/04/2020   Rh negative state in antepartum period, first trimester 08/04/2020   Cigar smoker 2/day + vapes 02/21/2020   Marijuana use 02/21/2020   Syphilis 02/07/2019   Overweight 01/06/2018    No chief complaint on file.   HPI  Patient reports to clinic today for treatment for Syphilis.  Patient was seen on 02/05/22 for RPR follow up due to history of Syphilis.  On 02/05/22 her RPR titer was 1:128.  After further discussion with STD supervisor, it was discovered that patient was inadequately treated in February.  Patient only received 2 doses.  Patient reports today to restart treatment for Syphilis.    Does the patient using douching products? No  Last HIV test per patient/review of record was:  Lab Results  Component Value Date   HMHIVSCREEN Negative - Validated 02/05/2022    Lab Results  Component Value Date   HIV Non Reactive 02/14/2018   HIV NON REACTIVE 02/14/2018   Patient reports last pap was: 2019   Screening for MPX risk: Does the patient have an unexplained rash? No Is the patient MSM? No Does the patient endorse multiple sex partners or anonymous sex partners? No Did the patient have close or sexual contact with a person diagnosed with  MPX? No Has the patient traveled outside the [place] where MPX is endemic? No Is there a high clinical suspicion for MPX-- evidenced by one of the following No  -Unlikely to be chickenpox  -Lymphadenopathy  -Rash that present in same phase of evolution on any given body part See flowsheet for further details and programmatic requirements.   Immunization history:  Immunization History  Administered Date(s) Administered   Hepatitis B 1994[birth year], 11/26/1994, 05/01/1995   Hepatitis B, adult 08/27/1994   MMR 11/04/1995, 10/17/1999   Moderna Sars-Covid-2 Vaccination 11/30/2019     The following portions of the patient's history were reviewed and updated as appropriate: allergies, current medications, past medical history, past social history, past surgical history and problem list.  Objective:  There were no vitals filed for this visit.  Physical Exam Constitutional:      Appearance: Normal appearance.  HENT:     Head: Normocephalic. No abrasion, masses or laceration. Hair is normal.     Right Ear: External ear normal.     Left Ear: External ear normal.     Nose: Nose normal.     Mouth/Throat:     Lips: Pink.     Mouth: Mucous membranes are moist. No oral lesions.     Pharynx: No oropharyngeal exudate or posterior oropharyngeal erythema.     Tonsils: No tonsillar exudate or tonsillar abscesses.  Eyes:     General: Lids are normal.        Right eye: No discharge.  Left eye: No discharge.     Conjunctiva/sclera: Conjunctivae normal.     Right eye: No exudate.    Left eye: No exudate. Pulmonary:     Effort: Pulmonary effort is normal.  Genitourinary:    Comments: Deferred, declined genital exam  Musculoskeletal:     Cervical back: Full passive range of motion without pain, normal range of motion and neck supple.  Lymphadenopathy:     Cervical: No cervical adenopathy.     Right cervical: No superficial, deep or posterior cervical adenopathy.    Left cervical: No superficial,  deep or posterior cervical adenopathy.     Upper Body:     Right upper body: No supraclavicular, axillary or epitrochlear adenopathy.     Left upper body: No supraclavicular, axillary or epitrochlear adenopathy.  Skin:    General: Skin is warm and dry.     Findings: No lesion or rash.  Neurological:     Mental Status: She is alert and oriented to person, place, and time.  Psychiatric:        Attention and Perception: Attention normal.        Mood and Affect: Mood normal.        Speech: Speech normal.        Behavior: Behavior normal. Behavior is cooperative.      Assessment and Plan:  Brenda Aguirre is a 26 y.o. female presenting to the [facility] Department for STI screening  1. Syphilis -Patient declines all other STD screening. Treat patient today with Bicillin 2.4 million units x 1 dose.  Patient to return back to clinic next week for the next dose.  Advised patient not to have sex.    - penicillin g benzathine (BICILLIN LA) 1200000 UNIT/2ML injection 2.4 Million Units   Total time spent: 30 minutes   Return in about 1 week (around 03/19/2022).  Future Appointments  Date Time Provider [place]  03/19/2022 11:00 AM [facility] PROVIDER [facility] None    Gregary Cromer, FNP

## 2022-03-12 NOTE — Progress Notes (Signed)
Bicillin given IM 1.2 MU LUOQ and 1.2 MU RUOQ for a total of 2.4 MU. Patient tolerated well.   Patient to return in clinic 03/19/22 for 10:30 arrival time for next injection. Patient verbalized understanding.   Al Decant, RN

## 2022-03-12 NOTE — Progress Notes (Unsigned)
Positive for syphilis 02/19/2022. Here for treatment. Bthiele RN

## 2022-03-17 NOTE — Telephone Encounter (Addendum)
Pt seen by [facility] provider on 03/12/22. Order for Bicillin x3:  1st set= administered 03/12/22 2nd set = administered 03/19/22 3rd set = administered 03/26/22

## 2022-03-19 ENCOUNTER — Ambulatory Visit: Payer: Self-pay

## 2022-03-19 ENCOUNTER — Ambulatory Visit: Payer: Self-pay | Admitting: Nurse Practitioner

## 2022-03-19 ENCOUNTER — Encounter: Payer: Self-pay | Admitting: Nurse Practitioner

## 2022-03-19 DIAGNOSIS — A539 Syphilis, unspecified: Secondary | ICD-10-CM

## 2022-03-19 MED ORDER — PENICILLIN G BENZATHINE 1200000 UNIT/2ML IM SUSY
2.4000 10*6.[IU] | PREFILLED_SYRINGE | Freq: Once | INTRAMUSCULAR | Status: AC
  Administered 2022-03-19: 2.4 10*6.[IU] via INTRAMUSCULAR

## 2022-03-19 NOTE — Progress Notes (Unsigned)
Bicillin treatment #2 given IM 1.2 MU LUOQ and 1.2 MU RUOQ for a total of 2.4 MU. Patient tolerated well.    Patient to return in clinic 03/26/22 for 10:30 arrival time for next injection.   Patient verbalized understanding.    Al Decant, RN

## 2022-03-19 NOTE — Progress Notes (Unsigned)
S: 26 year old female in clinic today to receive 2 dose of Bicillin for Syphilis treatment.  O:  Patient alert and oriented x 3.  RPR 02/05/22 1: 128.  A: Syphilis   P: Treat with Bicillin 2.4 million units IM today. No sex for 14 days after completion of treatment.  Rec condoms with all sex Patient to return to clinic next week for 3 and final dose. Gregary Cromer, FNP   1. Syphilis - penicillin g benzathine (BICILLIN LA) 1200000 UNIT/2ML injection 2.4 Million Units   Total time spent: 20 minutes

## 2022-03-20 ENCOUNTER — Encounter: Payer: Self-pay | Admitting: Nurse Practitioner

## 2022-03-26 ENCOUNTER — Ambulatory Visit: Payer: Self-pay | Admitting: Nurse Practitioner

## 2022-03-26 ENCOUNTER — Encounter: Payer: Self-pay | Admitting: Family Medicine

## 2022-03-26 DIAGNOSIS — A539 Syphilis, unspecified: Secondary | ICD-10-CM

## 2022-03-26 MED ORDER — PENICILLIN G BENZATHINE 1200000 UNIT/2ML IM SUSY
2.4000 10*6.[IU] | PREFILLED_SYRINGE | Freq: Once | INTRAMUSCULAR | Status: AC
  Administered 2022-03-26: 2.4 10*6.[IU] via INTRAMUSCULAR

## 2022-03-26 NOTE — Progress Notes (Signed)
S: 26 year old female in clinic today to receive 3rd dose of Bicillin for Syphilis treatment.   O:  Patient alert and oriented x 3.  RPR 02/05/22 1: 128.   A: Syphilis    P: Treat with Bicillin 2.4 million units IM today by Gregary Cromer, FNP. No sex for 14 days after completion of treatment.  Rec condoms with all sex Patient to return to clinic in 6 months for repeat RPR.  1. Syphilis - penicillin g benzathine (BICILLIN LA) 1200000 UNIT/2ML injection 2.4 Million Units   [facility] FNP

## 2022-03-26 NOTE — Progress Notes (Signed)
Bicillin treatment #3 given IM 1.2 MU LUOQ and 1.2 MU RUOQ for a total of 2.4 MU.   Patient tolerated well.    Patient to return to clinic in 6 months for blood work follow-up.    Patient verbalized understanding.    Al Decant, RN

## 2022-08-07 ENCOUNTER — Ambulatory Visit: Payer: Self-pay | Admitting: Licensed Practical Nurse

## 2022-10-21 ENCOUNTER — Ambulatory Visit: Payer: Self-pay

## 2022-11-10 ENCOUNTER — Ambulatory Visit: Payer: Self-pay

## 2023-11-20 ENCOUNTER — Ambulatory Visit (INDEPENDENT_AMBULATORY_CARE_PROVIDER_SITE_OTHER): Payer: Self-pay | Admitting: Student

## 2023-11-20 ENCOUNTER — Other Ambulatory Visit: Payer: Self-pay | Admitting: Student

## 2023-11-20 ENCOUNTER — Encounter: Payer: Self-pay | Admitting: Student

## 2023-11-20 VITALS — BP 116/74 | HR 90 | Ht 61.0 in | Wt 162.2 lb

## 2023-11-20 DIAGNOSIS — J45909 Unspecified asthma, uncomplicated: Secondary | ICD-10-CM | POA: Insufficient documentation

## 2023-11-20 DIAGNOSIS — A539 Syphilis, unspecified: Secondary | ICD-10-CM

## 2023-11-20 DIAGNOSIS — F17290 Nicotine dependence, other tobacco product, uncomplicated: Secondary | ICD-10-CM

## 2023-11-20 DIAGNOSIS — Z3169 Encounter for other general counseling and advice on procreation: Secondary | ICD-10-CM | POA: Insufficient documentation

## 2023-11-20 DIAGNOSIS — J4540 Moderate persistent asthma, uncomplicated: Secondary | ICD-10-CM

## 2023-11-20 MED ORDER — BUDESONIDE-FORMOTEROL FUMARATE 80-4.5 MCG/ACT IN AERO
2.0000 | INHALATION_SPRAY | Freq: Two times a day (BID) | RESPIRATORY_TRACT | 2 refills | Status: AC
Start: 2023-11-20 — End: ?

## 2023-11-20 MED ORDER — ALBUTEROL SULFATE HFA 108 (90 BASE) MCG/ACT IN AERS: 2.0000 | INHALATION_SPRAY | Freq: Four times a day (QID) | RESPIRATORY_TRACT | 1 refills | Status: AC | PRN

## 2023-11-20 MED ORDER — NICOTINE 21 MG/24HR TD PT24: 21.0000 mg | MEDICATED_PATCH | Freq: Every day | TRANSDERMAL | 1 refills | Status: AC

## 2023-11-20 NOTE — Assessment & Plan Note (Signed)
 Mildly persistent asthma, using grandmother rescue inhaler every other day with nighttime awakening. No wheezing or respiratory distress on exam.  -Symbicort  2 puffs twice daily -Albuterol  as needed -encouraged smoking cessation

## 2023-11-20 NOTE — Assessment & Plan Note (Signed)
 Discussed smoking cessation, she is interested in stopping and would like to try nicotine  patches. -Nicotine  21 mg day patches, encourage her to set a quit date.

## 2023-11-20 NOTE — Patient Instructions (Signed)
 Please take a fiber supplement such as metamucil or benefiber with 8 oz of waster once a day  Please increase dietary fiber, chia seeds are a good source of fiber if you enjoy eating it

## 2023-11-20 NOTE — Assessment & Plan Note (Signed)
 RPR titer 1:128 in on 02/05/2022, treated with penicillin  [facility]ine [facility], did not have follow up titer. She will make follow up with health department for this a she is self pay.

## 2023-11-20 NOTE — Progress Notes (Signed)
 New Patient Office Visit  Subjective    Patient ID: Brenda Aguirre, female    DOB: 1996-01-24  Age: 28 y.o. MRN: [id number]  CC:  Chief Complaint  Patient presents with   Establish Care    Patient is here today to establish care    Asthma    Refill on inhaler   HPI Brenda Aguirre  is a 28 y.o. person living with asthma presents to establish care.   Asthma since childhood previously on as needed inhaler until she was a teenage and since has only been as needed medication since. Thinks she was on albuterol , in the last few years has using her grandmother rescue inhaler as needed. Does not know exactly what medication it is.  Uses inhaler every other night, mostly at nighttime. Does smoke cigars daily. Has tried smoking in the past but was unsuccessful, has not tried medical therapy before.   Is interested in conceiving, currently sexually active with 1 female partner, history of ectopic pregnancy s/p b/l salpingectomy and looking into fertility clinic.   Reports history of syphilis treated with penicillin  g benzathine in 2023, did not have repeat RPR following treatment.   Outpatient Encounter Medications as of 11/20/2023  Medication Sig   albuterol  (VENTOLIN  HFA) 108 (90 Base) MCG/ACT inhaler Inhale 2 puffs into the lungs every 6 (six) hours as needed for wheezing or shortness of breath.   budesonide -formoterol  (SYMBICORT ) 80-4.5 MCG/ACT inhaler Inhale 2 puffs into the lungs in the morning and at bedtime.   nicotine  (NICODERM CQ  - DOSED IN MG/24 HOURS) 21 mg/24hr patch Place 1 patch (21 mg total) onto the skin daily. Apply patch to clean, dry skin in the morning and remove each night before bed. Stop cigarette use completely while on the patch.   [DISCONTINUED] albuterol  (PROVENTIL ) (2.5 MG/3ML) 0.083% nebulizer solution Take 3 mLs (2.5 mg total) by nebulization every 6 (six) hours as needed for wheezing or shortness of breath.   [DISCONTINUED] ibuprofen  (ADVIL ) 600 MG tablet  Take 1 tablet (600 mg total) by mouth every 6 (six) hours as needed.   No facility-administered encounter medications on file as of 11/20/2023.    Past Medical History:  Diagnosis Date   Asthma    Fibroids 08/04/2020    Past Surgical History:  Procedure Laterality Date   DIAGNOSTIC LAPAROSCOPY WITH REMOVAL OF ECTOPIC PREGNANCY Left 08/20/2020   Procedure: DIAGNOSTIC LAPAROSCOPY WITH REMOVAL OF ECTOPIC PREGNANCY; SALPINGECTOMY;  Surgeon: Connell Davies, MD;  Location: [facility] ORS;  Service: Gynecology;  Laterality: Left;   DIAGNOSTIC LAPAROSCOPY WITH REMOVAL OF ECTOPIC PREGNANCY Right 12/12/2020   Procedure: LAPAROSCOPIC Right salpingectomy;  Surgeon: Victor Claudell SAUNDERS, MD;  Location: [facility] ORS;  Service: Gynecology;  Laterality: Right;   NO PAST SURGERIES      Family History  Problem Relation Age of Onset   Hypertension Mother    Healthy Father    Hypertension Maternal Grandmother    Heart attack Maternal Grandmother     Social History   Socioeconomic History   Marital status: Single    Spouse name: Not on file   Number of children: Not on file   Years of education: Not on file   Highest education level: Not on file  Occupational History   Not on file  Tobacco Use   Smoking status: Every Day    Types: Cigars, E-cigarettes    Start date: 2001   Smokeless tobacco: Never   Tobacco comments:    Black and mild twice  Vaping Use   Vaping status: Every Day   Start date: 09/29/2018  Substance and Sexual Activity   Alcohol use: Not Currently    Alcohol/week: 3.0 standard drinks of alcohol    Types: 1 Glasses of wine, 2 Shots of liquor per week    Comment: 2-3 times a week   Drug use: Yes    Frequency: 3.0 times per week    Types: Marijuana    Comment: every week   Sexual activity: Yes    Partners: Male    Birth control/protection: None    Comment: 1 female partner, has had female partners in the past  Other Topics Concern   Not on file  Social History Narrative   Not on  file   Social Drivers of Health   Financial Resource Strain: Not on file  Food Insecurity: Not on file  Transportation Needs: Not on file  Physical Activity: Unknown (11/19/2023)   Exercise Vital Sign    Days of Exercise per Week: Patient declined    Minutes of Exercise per Session: Not on file  Stress: Not on file  Social Connections: Unknown (11/19/2023)   Social Connection and Isolation Panel    Frequency of Communication with Friends and Family: More than three times a week    Frequency of Social Gatherings with Friends and Family: Twice a week    Attends Religious Services: 1 to 4 times per year    Active Member of [employer] or Organizations: No    Attends Banker Meetings: Not on file    Marital Status: Not on file  Intimate Partner Violence: Not At Risk (03/12/2022)   Humiliation, Afraid, Rape, and Kick questionnaire    Fear of Current or Ex-Partner: No    Emotionally Abused: No    Physically Abused: No    Sexually Abused: No    ROS Refer to HPI    Objective   BP 116/74   Pulse 90   Ht 5' 1 (1.549 m)   Wt 162 lb 4 oz (73.6 kg)   LMP 10/30/2023   SpO2 96%   Breastfeeding No   BMI 30.66 kg/m   Physical Exam Constitutional:      Appearance: Normal appearance.  HENT:     Mouth/Throat:     Mouth: Mucous membranes are moist.     Pharynx: Oropharynx is clear.  Cardiovascular:     Rate and Rhythm: Normal rate and regular rhythm.  Pulmonary:     Effort: Pulmonary effort is normal.     Breath sounds: No rhonchi or rales.  Abdominal:     General: Abdomen is flat. Bowel sounds are normal. There is no distension.     Palpations: Abdomen is soft.     Tenderness: There is abdominal tenderness (mild pelvic discomfort on palpation).  Musculoskeletal:        General: Normal range of motion.     Right lower leg: No edema.     Left lower leg: No edema.  Skin:    General: Skin is warm and dry.     Capillary Refill: Capillary refill takes less than 2 seconds.   Neurological:     General: No focal deficit present.     Mental Status: She is alert and oriented to person, place, and time.  Psychiatric:        Mood and Affect: Mood normal.        Behavior: Behavior normal.        11/20/2023    9:15  AM 05/29/2021    2:13 PM 12/03/2020    2:59 PM  Depression screen PHQ 2/9  Decreased Interest 1 0 0  Down, Depressed, Hopeless 1 0 0  PHQ - 2 Score 2 0 0  Altered sleeping 1    Tired, decreased energy 1    Change in appetite 0    Feeling bad or failure about yourself  1    Trouble concentrating 0    Moving slowly or fidgety/restless 0    Suicidal thoughts 0    PHQ-9 Score 5    Difficult doing work/chores Somewhat difficult        11/20/2023    9:15 AM  GAD 7 : Generalized Anxiety Score  Nervous, Anxious, on Edge 1  Control/stop worrying 1  Worry too much - different things 1  Trouble relaxing 0  Restless 1  Easily annoyed or irritable 1  Afraid - awful might happen 0  Total GAD 7 Score 5  Anxiety Difficulty Somewhat difficult    Last CBC Lab Results  Component Value Date   WBC 4.8 12/12/2020   HGB 12.4 12/12/2020   HCT 33.4 (L) 12/12/2020   MCV 72.6 (L) 12/12/2020   MCH 27.0 12/12/2020   RDW 19.7 (H) 12/12/2020   PLT 289 12/12/2020   Last metabolic panel Lab Results  Component Value Date   GLUCOSE 118 (H) 12/12/2020   NA 135 12/12/2020   K 3.4 (L) 12/12/2020   CL 105 12/12/2020   CO2 24 12/12/2020   BUN 7 12/12/2020   CREATININE 0.58 12/12/2020   GFRNONAA >60 12/12/2020   CALCIUM 9.1 12/12/2020   PROT 7.2 12/12/2020   ALBUMIN  4.0 12/12/2020   BILITOT 0.8 12/12/2020   ALKPHOS 39 12/12/2020   AST 17 12/12/2020   ALT 14 12/12/2020   ANIONGAP 6 12/12/2020        Assessment & Plan:  Cigar smoker 2/day + vapes Assessment & Plan: Discussed smoking cessation, she is interested in stopping and would like to try nicotine  patches. -Nicotine  21 mg day patches, encourage her to set a quit date.     Syphilis Assessment & Plan: RPR titer 1:128 in on 02/05/2022, [facility] penicillin  [facility], did not have follow up titer. She will make follow up with health department for this a she is self pay.    Moderate persistent asthma without complication Assessment & Plan: Mildly persistent asthma, using grandmother rescue inhaler every other day with nighttime awakening. No wheezing or respiratory distress on exam.  -Symbicort  2 puffs twice daily -Albuterol  as needed -encouraged smoking cessation   Encounter for preconception consultation Assessment & Plan: Interested in conception, hx of b/l salpingectomy 2/2 ectopic pregnancies. Discussed daily prenatal vitamin, smoking cessation, and follow up of RPR titers. She is looking into fertility clinics at [facility] and will let me know if she requires a referral.     Other orders -     Budesonide -Formoterol  Fumarate; Inhale 2 puffs into the lungs in the morning and at bedtime.  Dispense: 1 each; Refill: 2 -     Albuterol  Sulfate HFA; Inhale 2 puffs into the lungs every 6 (six) hours as needed for wheezing or shortness of breath.  Dispense: 1 each; Refill: 1 -     Nicotine ; Place 1 patch (21 mg total) onto the skin daily. Apply patch to clean, dry skin in the morning and remove each night before bed. Stop cigarette use completely while on the patch.  Dispense: 42  patch; Refill: 1    Return in about 1 month (around 12/21/2023).   Harlene Saddler, MD

## 2023-11-20 NOTE — Assessment & Plan Note (Signed)
 Interested in conception, hx of b/l salpingectomy 2/2 ectopic pregnancies. Discussed daily prenatal vitamin, smoking cessation, and follow up of RPR titers. She is looking into fertility clinics at [facility] and will let me know if she requires a referral.

## 2023-12-01 ENCOUNTER — Ambulatory Visit: Payer: Self-pay

## 2023-12-29 ENCOUNTER — Encounter: Payer: Self-pay | Admitting: Student

## 2024-02-19 DIAGNOSIS — Z113 Encounter for screening for infections with a predominantly sexual mode of transmission: Secondary | ICD-10-CM | POA: Diagnosis not present

## 2024-02-19 DIAGNOSIS — R350 Frequency of micturition: Secondary | ICD-10-CM | POA: Diagnosis not present

## 2024-02-19 DIAGNOSIS — N760 Acute vaginitis: Secondary | ICD-10-CM | POA: Diagnosis not present

## 2024-02-19 DIAGNOSIS — B9689 Other specified bacterial agents as the cause of diseases classified elsewhere: Secondary | ICD-10-CM | POA: Diagnosis not present

## 2024-02-19 DIAGNOSIS — Z8619 Personal history of other infectious and parasitic diseases: Secondary | ICD-10-CM | POA: Diagnosis not present
